# Patient Record
Sex: Male | Born: 1960 | Race: White | Hispanic: No | Marital: Married | Smoking: Former smoker
Health system: Southern US, Community
[De-identification: ages and names within clinical notes are randomized; demographics above are authoritative.]

## PROBLEM LIST (undated history)

## (undated) DIAGNOSIS — N200 Calculus of kidney: Secondary | ICD-10-CM

## (undated) DIAGNOSIS — M1990 Unspecified osteoarthritis, unspecified site: Secondary | ICD-10-CM

## (undated) DIAGNOSIS — E039 Hypothyroidism, unspecified: Secondary | ICD-10-CM

## (undated) DIAGNOSIS — E079 Disorder of thyroid, unspecified: Secondary | ICD-10-CM

## (undated) DIAGNOSIS — K219 Gastro-esophageal reflux disease without esophagitis: Secondary | ICD-10-CM

## (undated) DIAGNOSIS — G4730 Sleep apnea, unspecified: Secondary | ICD-10-CM

## (undated) DIAGNOSIS — E119 Type 2 diabetes mellitus without complications: Secondary | ICD-10-CM

## (undated) DIAGNOSIS — R011 Cardiac murmur, unspecified: Secondary | ICD-10-CM

## (undated) DIAGNOSIS — Z87442 Personal history of urinary calculi: Secondary | ICD-10-CM

## (undated) DIAGNOSIS — N281 Cyst of kidney, acquired: Secondary | ICD-10-CM

## (undated) DIAGNOSIS — C801 Malignant (primary) neoplasm, unspecified: Secondary | ICD-10-CM

## (undated) DIAGNOSIS — I10 Essential (primary) hypertension: Secondary | ICD-10-CM

## (undated) HISTORY — PX: KNEE ARTHROSCOPY W/ MENISCAL REPAIR: SHX1877

## (undated) HISTORY — DX: Unspecified osteoarthritis, unspecified site: M19.90

## (undated) HISTORY — PX: BLADDER TUMOR EXCISION: SHX238

## (undated) HISTORY — DX: Malignant (primary) neoplasm, unspecified: C80.1

## (undated) HISTORY — DX: Essential (primary) hypertension: I10

## (undated) HISTORY — PX: CARPAL TUNNEL WITH CUBITAL TUNNEL: SHX5608

## (undated) HISTORY — DX: Gastro-esophageal reflux disease without esophagitis: K21.9

## (undated) HISTORY — PX: BACK SURGERY: SHX140

## (undated) HISTORY — DX: Type 2 diabetes mellitus without complications: E11.9

## (undated) HISTORY — DX: Disorder of thyroid, unspecified: E07.9

## (undated) HISTORY — DX: Calculus of kidney: N20.0

## (undated) HISTORY — PX: NECK SURGERY: SHX720

## (undated) HISTORY — PX: ELBOW SURGERY: SHX618

---

## 2008-12-15 ENCOUNTER — Emergency Department (HOSPITAL_COMMUNITY): Admission: EM | Admit: 2008-12-15 | Discharge: 2008-12-15 | Payer: Self-pay | Admitting: Emergency Medicine

## 2015-09-06 ENCOUNTER — Other Ambulatory Visit (HOSPITAL_COMMUNITY): Payer: Self-pay | Admitting: Neurological Surgery

## 2015-09-06 DIAGNOSIS — M5126 Other intervertebral disc displacement, lumbar region: Secondary | ICD-10-CM

## 2015-09-13 ENCOUNTER — Ambulatory Visit (HOSPITAL_COMMUNITY)
Admission: RE | Admit: 2015-09-13 | Discharge: 2015-09-13 | Disposition: A | Discharge status: Home / Self Care | Payer: Medicare FFS | Source: Ambulatory Visit | Attending: Neurological Surgery | Admitting: Neurological Surgery

## 2015-09-13 DIAGNOSIS — M5126 Other intervertebral disc displacement, lumbar region: Secondary | ICD-10-CM | POA: Diagnosis present

## 2015-09-13 LAB — POCT I-STAT CREATININE: Creatinine, Ser: 1.1 mg/dL (ref 0.61–1.24)

## 2015-09-13 MED ORDER — GADOBENATE DIMEGLUMINE 529 MG/ML IV SOLN
18.0000 mL | Freq: Once | INTRAVENOUS | Status: AC | PRN
  Administered 2015-09-13: 18 mL via INTRAVENOUS

## 2015-12-03 ENCOUNTER — Other Ambulatory Visit (HOSPITAL_COMMUNITY): Payer: Self-pay | Admitting: Neurological Surgery

## 2015-12-03 DIAGNOSIS — M5416 Radiculopathy, lumbar region: Secondary | ICD-10-CM

## 2015-12-10 ENCOUNTER — Ambulatory Visit (HOSPITAL_COMMUNITY)
Admission: RE | Admit: 2015-12-10 | Discharge: 2015-12-10 | Disposition: A | Discharge status: Home / Self Care | Payer: Medicare FFS | Source: Ambulatory Visit | Attending: Neurological Surgery | Admitting: Neurological Surgery

## 2015-12-10 DIAGNOSIS — M5126 Other intervertebral disc displacement, lumbar region: Secondary | ICD-10-CM | POA: Diagnosis not present

## 2015-12-10 DIAGNOSIS — M2578 Osteophyte, vertebrae: Secondary | ICD-10-CM | POA: Diagnosis not present

## 2015-12-10 DIAGNOSIS — Z9889 Other specified postprocedural states: Secondary | ICD-10-CM | POA: Diagnosis not present

## 2015-12-10 DIAGNOSIS — M4806 Spinal stenosis, lumbar region: Secondary | ICD-10-CM | POA: Diagnosis not present

## 2015-12-10 DIAGNOSIS — M5416 Radiculopathy, lumbar region: Secondary | ICD-10-CM | POA: Insufficient documentation

## 2015-12-10 LAB — POCT I-STAT CREATININE: Creatinine, Ser: 1.1 mg/dL (ref 0.61–1.24)

## 2015-12-10 MED ORDER — GADOBENATE DIMEGLUMINE 529 MG/ML IV SOLN
20.0000 mL | Freq: Once | INTRAVENOUS | Status: AC | PRN
  Administered 2015-12-10: 20 mL via INTRAVENOUS

## 2015-12-30 ENCOUNTER — Other Ambulatory Visit (HOSPITAL_COMMUNITY): Payer: Self-pay | Admitting: Neurological Surgery

## 2015-12-30 DIAGNOSIS — M5000 Cervical disc disorder with myelopathy, unspecified cervical region: Secondary | ICD-10-CM

## 2016-01-09 ENCOUNTER — Ambulatory Visit (HOSPITAL_COMMUNITY)
Admission: RE | Admit: 2016-01-09 | Discharge: 2016-01-09 | Disposition: A | Discharge status: Home / Self Care | Payer: Medicare FFS | Source: Ambulatory Visit | Attending: Neurological Surgery | Admitting: Neurological Surgery

## 2016-01-09 DIAGNOSIS — M2578 Osteophyte, vertebrae: Secondary | ICD-10-CM | POA: Diagnosis not present

## 2016-01-09 DIAGNOSIS — M5000 Cervical disc disorder with myelopathy, unspecified cervical region: Secondary | ICD-10-CM | POA: Diagnosis not present

## 2016-01-09 DIAGNOSIS — M5124 Other intervertebral disc displacement, thoracic region: Secondary | ICD-10-CM | POA: Insufficient documentation

## 2016-01-09 DIAGNOSIS — M4802 Spinal stenosis, cervical region: Secondary | ICD-10-CM | POA: Insufficient documentation

## 2016-06-01 ENCOUNTER — Other Ambulatory Visit: Payer: Self-pay | Admitting: Neurological Surgery

## 2016-06-01 ENCOUNTER — Other Ambulatory Visit (HOSPITAL_COMMUNITY): Payer: Self-pay | Admitting: Neurological Surgery

## 2016-06-01 DIAGNOSIS — M5416 Radiculopathy, lumbar region: Secondary | ICD-10-CM

## 2016-06-01 DIAGNOSIS — M5124 Other intervertebral disc displacement, thoracic region: Secondary | ICD-10-CM

## 2016-06-30 ENCOUNTER — Other Ambulatory Visit (HOSPITAL_COMMUNITY): Payer: Self-pay | Admitting: Neurological Surgery

## 2016-06-30 DIAGNOSIS — IMO0002 Reserved for concepts with insufficient information to code with codable children: Secondary | ICD-10-CM

## 2016-06-30 DIAGNOSIS — M5020 Other cervical disc displacement, unspecified cervical region: Secondary | ICD-10-CM

## 2016-07-01 ENCOUNTER — Ambulatory Visit (HOSPITAL_COMMUNITY)
Admission: RE | Admit: 2016-07-01 | Discharge: 2016-07-01 | Disposition: A | Discharge status: Home / Self Care | Payer: Medicare FFS | Source: Ambulatory Visit | Attending: Neurological Surgery | Admitting: Neurological Surgery

## 2016-07-01 ENCOUNTER — Encounter (HOSPITAL_COMMUNITY): Payer: Self-pay

## 2016-07-01 DIAGNOSIS — I2510 Atherosclerotic heart disease of native coronary artery without angina pectoris: Secondary | ICD-10-CM | POA: Diagnosis not present

## 2016-07-01 DIAGNOSIS — M48061 Spinal stenosis, lumbar region without neurogenic claudication: Secondary | ICD-10-CM | POA: Insufficient documentation

## 2016-07-01 DIAGNOSIS — N200 Calculus of kidney: Secondary | ICD-10-CM | POA: Insufficient documentation

## 2016-07-01 DIAGNOSIS — M4802 Spinal stenosis, cervical region: Secondary | ICD-10-CM | POA: Insufficient documentation

## 2016-07-01 DIAGNOSIS — M5124 Other intervertebral disc displacement, thoracic region: Secondary | ICD-10-CM | POA: Insufficient documentation

## 2016-07-01 DIAGNOSIS — M2578 Osteophyte, vertebrae: Secondary | ICD-10-CM | POA: Insufficient documentation

## 2016-07-01 DIAGNOSIS — M5126 Other intervertebral disc displacement, lumbar region: Secondary | ICD-10-CM | POA: Diagnosis not present

## 2016-07-01 DIAGNOSIS — M4726 Other spondylosis with radiculopathy, lumbar region: Secondary | ICD-10-CM | POA: Diagnosis present

## 2016-07-01 DIAGNOSIS — IMO0002 Reserved for concepts with insufficient information to code with codable children: Secondary | ICD-10-CM

## 2016-07-01 DIAGNOSIS — M5020 Other cervical disc displacement, unspecified cervical region: Secondary | ICD-10-CM

## 2016-07-01 DIAGNOSIS — Z981 Arthrodesis status: Secondary | ICD-10-CM | POA: Diagnosis not present

## 2016-07-01 DIAGNOSIS — M5416 Radiculopathy, lumbar region: Secondary | ICD-10-CM

## 2016-07-01 DIAGNOSIS — M4722 Other spondylosis with radiculopathy, cervical region: Secondary | ICD-10-CM | POA: Insufficient documentation

## 2016-07-01 LAB — GLUCOSE, CAPILLARY
GLUCOSE-CAPILLARY: 207 mg/dL — AB (ref 65–99)
GLUCOSE-CAPILLARY: 131 mg/dL — AB (ref 65–99)

## 2016-07-01 MED ORDER — ONDANSETRON HCL 4 MG/2ML IJ SOLN: 4.0000 mg | Freq: Four times a day (QID) | INTRAMUSCULAR | Status: DC | PRN

## 2016-07-01 MED ORDER — HYDROCODONE-ACETAMINOPHEN 5-325 MG PO TABS
ORAL_TABLET | ORAL | Status: AC
  Filled 2016-07-01: qty 1

## 2016-07-01 MED ORDER — IOPAMIDOL (ISOVUE-M 300) INJECTION 61%
15.0000 mL | Freq: Once | INTRAMUSCULAR | Status: AC | PRN
  Administered 2016-07-01: 14 mL via INTRATHECAL

## 2016-07-01 MED ORDER — HYDROCODONE-ACETAMINOPHEN 5-325 MG PO TABS
1.0000 | ORAL_TABLET | ORAL | Status: DC | PRN
  Administered 2016-07-01: 1 via ORAL
  Filled 2016-07-01 (×2): qty 2

## 2016-07-01 MED ORDER — DIAZEPAM 5 MG PO TABS
ORAL_TABLET | ORAL | Status: AC
  Administered 2016-07-01: 10 mg via ORAL
  Filled 2016-07-01: qty 2

## 2016-07-01 MED ORDER — DIAZEPAM 5 MG PO TABS
10.0000 mg | ORAL_TABLET | Freq: Once | ORAL | Status: AC
  Administered 2016-07-01: 10 mg via ORAL
  Filled 2016-07-01: qty 2

## 2016-07-01 MED ORDER — LIDOCAINE HCL (PF) 1 % IJ SOLN
5.0000 mL | Freq: Once | INTRAMUSCULAR | Status: AC
Start: 2016-07-01 — End: 2016-07-01
  Administered 2016-07-01: 5 mL via INTRADERMAL

## 2016-07-01 NOTE — Progress Notes (Addendum)
Pt bp elevating, did not take metoprolol last night, both arms checked and larger cuff used. Dr Ellene Route was called, he will call back per office staff. Pt brought all meds, will continue to monitor. 160/105 p 105, 214/125 p 105 1133 another call made to office/ no call has been returned. Call was returned/ pt can leave per Dr Ellene Route, bp meds taken with bp lowering.

## 2016-07-01 NOTE — Procedures (Signed)
Asa Saunas is a 56 year old individual is had significant cervical and lumbar spondylosis with radiculopathy. He is undergone previous decompression at the L4-L5 level and has recurrent and residual lumbar radicular pain and back pain. He also has had a previous cervical spondylosis with radiculopathy and is undergone anterior decompression arthrodesis at the C7-T1 level. He now has symptoms in the C8 distribution the upper extremities. Because of the multitude of symptoms I advised a total myelogram and post milligrams CAT scan to better evaluate the nature of his problems and also because he has significant positional occurrences with pain particularly in his lumbar spine is suggested dynamic study would be of benefit to better elucidate the nature of his pain.  Pre op Dx: Lumbar spondylosis cervical spondylosis with radiculopathy Post op Dx: Same Procedure: Total myelogram Surgeon: Mellody Masri Puncture level: L2-3 Fluid color: Clear colorless Injection: Isovue-300, 14 mL Findings: Moderate spondylosis of the lumbar spine . mild spondylosis of the cervical spine. Further evaluation with CT scanning

## 2016-07-01 NOTE — Discharge Instructions (Signed)
Myelography, Care After °These instructions give you information on caring for yourself after your procedure. Your doctor may also give you more specific instructions. Call your doctor if you have any problems or questions after your procedure. °HOME CARE °· Rest the first day. °· When you rest, lie flat, with your head slightly raised (elevated). °· Avoid heavy lifting and activity for 48 hours, or as told by your doctor. °· You may take the bandage (dressing) off one day after the test, or as told by your doctor. °· Take all medicines only as told by your doctor. °· Ask your doctor when it is okay to take a shower or bath. °· Ask your doctor when your test results will be ready and how you can get them. Make sure you follow up and get your results. °· Do not drink alcohol for 24 hours, or as told by your doctor. °· Drink enough fluid to keep your pee (urine) clear or pale yellow. °GET HELP IF:  °· You have a fever. °· You have a headache. °· You feel sick to your stomach (nauseous) or throw up (vomit). °· You have pain or cramping in your belly (abdomen). °GET HELP RIGHT AWAY IF:  °· You have a headache with a stiff neck or fever. °· You have trouble breathing. °· Any of the places where the needles were put in are: °¨ Puffy (swollen) or red. °¨ Sore or hot to the touch. °¨ Draining yellowish-white fluid (pus). °¨ Bleeding. °MAKE SURE YOU: °· Understand these instructions. °· Will watch your condition. °· Will get help right away if you are not doing well or get worse. °This information is not intended to replace advice given to you by your health care provider. Make sure you discuss any questions you have with your health care provider. °Document Released: 02/11/2008 Document Revised: 05/25/2014 Document Reviewed: 02/14/2015 °Elsevier Interactive Patient Education © 2017 [employer] ° °

## 2017-08-31 ENCOUNTER — Other Ambulatory Visit (HOSPITAL_COMMUNITY): Payer: Self-pay | Admitting: Neurological Surgery

## 2017-08-31 ENCOUNTER — Other Ambulatory Visit (HOSPITAL_COMMUNITY): Payer: Self-pay | Admitting: Neurosurgery

## 2017-08-31 DIAGNOSIS — R29898 Other symptoms and signs involving the musculoskeletal system: Secondary | ICD-10-CM

## 2017-08-31 DIAGNOSIS — M47892 Other spondylosis, cervical region: Secondary | ICD-10-CM

## 2017-09-03 ENCOUNTER — Ambulatory Visit (HOSPITAL_COMMUNITY)
Admission: RE | Admit: 2017-09-03 | Discharge: 2017-09-03 | Disposition: A | Discharge status: Home / Self Care | Payer: Medicare FFS | Source: Ambulatory Visit | Attending: Neurosurgery | Admitting: Neurosurgery

## 2017-09-03 DIAGNOSIS — R29898 Other symptoms and signs involving the musculoskeletal system: Secondary | ICD-10-CM | POA: Diagnosis present

## 2017-09-03 DIAGNOSIS — M48061 Spinal stenosis, lumbar region without neurogenic claudication: Secondary | ICD-10-CM | POA: Insufficient documentation

## 2017-09-03 DIAGNOSIS — M47892 Other spondylosis, cervical region: Secondary | ICD-10-CM | POA: Insufficient documentation

## 2017-09-03 LAB — POCT I-STAT CREATININE: Creatinine, Ser: 1.1 mg/dL (ref 0.61–1.24)

## 2017-09-03 MED ORDER — GADOBENATE DIMEGLUMINE 529 MG/ML IV SOLN
20.0000 mL | Freq: Once | INTRAVENOUS | Status: AC | PRN
  Administered 2017-09-03: 20 mL via INTRAVENOUS

## 2017-10-19 ENCOUNTER — Other Ambulatory Visit: Payer: Self-pay | Admitting: Neurological Surgery

## 2017-10-19 DIAGNOSIS — M48062 Spinal stenosis, lumbar region with neurogenic claudication: Secondary | ICD-10-CM

## 2018-06-14 ENCOUNTER — Other Ambulatory Visit: Payer: Self-pay | Admitting: Neurological Surgery

## 2018-06-14 DIAGNOSIS — E109 Type 1 diabetes mellitus without complications: Secondary | ICD-10-CM

## 2018-07-14 ENCOUNTER — Other Ambulatory Visit: Payer: Self-pay | Admitting: *Deleted

## 2018-07-14 ENCOUNTER — Other Ambulatory Visit: Payer: Self-pay | Admitting: Neurological Surgery

## 2018-07-15 ENCOUNTER — Telehealth: Payer: Self-pay | Admitting: Vascular Surgery

## 2018-07-15 ENCOUNTER — Encounter: Payer: Self-pay | Admitting: Vascular Surgery

## 2018-07-15 NOTE — Telephone Encounter (Signed)
-----   Message from Willy Eddy, RN sent at 07/13/2018  3:16 PM EST ----- Regarding: office appointment with Dr. Carlis Abbott Please sched. Office appointment with Dr. Carlis Abbott. Alif with Dr. Ellene Route is scheduled for 09/05/2018.Remind patient to bring all films related to this procedure with him to this appt. Thanks, [employer]

## 2018-07-15 NOTE — Telephone Encounter (Signed)
sch appt lvm mld ltr 08/23/2018 1215pm f/u MD

## 2018-08-05 ENCOUNTER — Ambulatory Visit (HOSPITAL_COMMUNITY): Payer: Medicare PPO

## 2018-08-05 ENCOUNTER — Inpatient Hospital Stay: Admission: RE | Admit: 2018-08-05 | Payer: Medicare FFS | Source: Ambulatory Visit

## 2018-08-16 ENCOUNTER — Other Ambulatory Visit: Payer: Self-pay | Admitting: *Deleted

## 2018-08-18 ENCOUNTER — Other Ambulatory Visit: Payer: Self-pay

## 2018-08-18 ENCOUNTER — Ambulatory Visit
Admission: RE | Admit: 2018-08-18 | Discharge: 2018-08-18 | Disposition: A | Discharge status: Home / Self Care | Payer: Medicare PPO | Source: Ambulatory Visit | Attending: Neurological Surgery | Admitting: Neurological Surgery

## 2018-08-18 DIAGNOSIS — E109 Type 1 diabetes mellitus without complications: Secondary | ICD-10-CM

## 2018-08-23 ENCOUNTER — Other Ambulatory Visit: Payer: Self-pay

## 2018-08-23 ENCOUNTER — Ambulatory Visit: Payer: Medicare FFS | Admitting: Vascular Surgery

## 2018-08-23 ENCOUNTER — Ambulatory Visit (INDEPENDENT_AMBULATORY_CARE_PROVIDER_SITE_OTHER): Payer: Medicare PPO | Admitting: Vascular Surgery

## 2018-08-23 ENCOUNTER — Encounter: Payer: Self-pay | Admitting: Vascular Surgery

## 2018-08-23 VITALS — BP 146/95 | HR 100 | Temp 97.6°F | Resp 20 | Ht 68.0 in | Wt 213.0 lb

## 2018-08-23 DIAGNOSIS — M5137 Other intervertebral disc degeneration, lumbosacral region: Secondary | ICD-10-CM

## 2018-08-23 NOTE — Progress Notes (Signed)
Vascular and Vein Specialist of [facility]  Patient name: Tony Christensen MRN: [id number] DOB: [birth year] Sex: male  REASON FOR CONSULT: Discuss anterior exposure for L4-5 and L5-S1 disc surgery.  HPI: Tony Christensen is a 58 y.o. male, who is day for discussion of anterior exposure for spine surgery with Dr. Ellene Route.  He has a long history of progressive disability associated with this.  Has had prior cervical fusion and also discectomy in the past.  He has had progressive pain somewhat in his back but progressive weakness in his lower extremities as well.  He is seen today for discussion of anterior exposure.  Not have a history of cardiac disease.  Past Medical History:  Diagnosis Date  . Arthritis   . Cancer ([place])    Bladder/lung  . Diabetes mellitus without complication ([place])   . GERD (gastroesophageal reflux disease)   . Hypertension   . Kidney stones   . Thyroid disease     Family History  Problem Relation Age of Onset  . Fibromyalgia Mother   . Arthritis Mother   . Brain cancer Father   . Lung cancer Father   . Hypertension Father   . Heart disease Father     SOCIAL HISTORY: Social History   Socioeconomic History  . Marital status: Married    Spouse name: Not on file  . Number of children: Not on file  . Years of education: Not on file  . Highest education level: Not on file  Occupational History  . Not on file  Social Needs  . Financial resource strain: Not on file  . Food insecurity:    Worry: Not on file    Inability: Not on file  . Transportation needs:    Medical: Not on file    Non-medical: Not on file  Tobacco Use  . Smoking status: Former Research scientist (life sciences)  . Smokeless tobacco: Never Used  Substance and Sexual Activity  . Alcohol use: Yes    Comment: socially  . Drug use: Never  . Sexual activity: Not on file  Lifestyle  . Physical activity:    Days per week: Not on file    Minutes per session: Not on file  .  Stress: Not on file  Relationships  . Social connections:    Talks on phone: Not on file    Gets together: Not on file    Attends religious service: Not on file    Active member of club or organization: Not on file    Attends meetings of clubs or organizations: Not on file    Relationship status: Not on file  . Intimate partner violence:    Fear of current or ex partner: Not on file    Emotionally abused: Not on file    Physically abused: Not on file    Forced sexual activity: Not on file  Other Topics Concern  . Not on file  Social History Narrative  . Not on file    Allergies  Allergen Reactions  . Ceclor [Cefaclor] Anaphylaxis    Current Outpatient Medications  Medication Sig Dispense Refill  . amLODipine (NORVASC) 10 MG tablet Take 10 mg by mouth at bedtime.     Marland Kitchen azelastine (ASTELIN) 0.1 % nasal spray Place 2 sprays into both nostrils 2 (two) times daily as needed for allergies.     . benazepril (LOTENSIN) 5 MG tablet Take 5 mg by mouth daily.    . Cholecalciferol (VITAMIN D-3) 5000 units TABS Take 10,000  Units by mouth daily.     . cyclobenzaprine (FLEXERIL) 10 MG tablet Take 10 mg by mouth at bedtime.     . diphenhydrAMINE (BENADRYL) 25 mg capsule Take 50-100 mg by mouth daily as needed for allergies.    . fluticasone (FLONASE) 50 MCG/ACT nasal spray Place 1 spray into both nostrils daily as needed for allergies.     . HONEY PO Take 15 mLs by mouth every morning.    Marland Kitchen HYDROcodone-acetaminophen (NORCO) 10-325 MG tablet Take 1 tablet by mouth 3 (three) times daily as needed for moderate pain. for pain    . levothyroxine (SYNTHROID, LEVOTHROID) 75 MCG tablet Take 75 mcg by mouth daily before breakfast.     . magnesium chloride (SLOW-MAG) 64 MG TBEC SR tablet Take 1 tablet by mouth daily.    . meloxicam (MOBIC) 7.5 MG tablet Take 7.5 mg by mouth daily.    . metFORMIN (GLUCOPHAGE) 500 MG tablet Take 500 mg by mouth 2 (two) times daily with a meal.    . metoprolol succinate  (TOPROL-XL) 50 MG 24 hr tablet Take 50 mg by mouth every evening. Take with or immediately following a meal.    . Multiple Vitamins-Minerals (MULTIVITAMIN WITH MINERALS) tablet Take 1 tablet by mouth daily.    . nortriptyline (PAMELOR) 10 MG capsule Take 10 mg by mouth at bedtime.    Marland Kitchen NOVOLOG FLEXPEN 100 UNIT/ML FlexPen Inject 6-18 Units into the skin See admin instructions. Takes with meals as needed    . omeprazole (PRILOSEC) 20 MG capsule Take 20 mg by mouth every morning.    . pregabalin (LYRICA) 100 MG capsule Take 100 mg by mouth 2 (two) times daily.     . tamsulosin (FLOMAX) 0.4 MG CAPS capsule Take 0.4 mg by mouth every evening.    Marland Kitchen atorvastatin (LIPITOR) 20 MG tablet Take 20 mg by mouth daily.     No current facility-administered medications for this visit.     REVIEW OF SYSTEMS:  [X]  denotes positive finding, [ ]  denotes negative finding Cardiac  Comments:  Chest pain or chest pressure:    Shortness of breath upon exertion:    Short of breath when lying flat:    Irregular heart rhythm:        Vascular    Pain in calf, thigh, or hip brought on by ambulation: x   Pain in feet at night that wakes you up from your sleep:  x   Blood clot in your veins:    Leg swelling:  x       Pulmonary    Oxygen at home:    Productive cough:     Wheezing:         Neurologic    Sudden weakness in arms or legs:  x   Sudden numbness in arms or legs:  x   Sudden onset of difficulty speaking or slurred speech:    Temporary loss of vision in one eye:     Problems with dizziness:         Gastrointestinal    Blood in stool:     Vomited blood:         Genitourinary    Burning when urinating:     Blood in urine:        Psychiatric    Major depression:         Hematologic    Bleeding problems:    Problems with blood clotting too easily:  Skin    Rashes or ulcers:        Constitutional    Fever or chills:      PHYSICAL EXAM: Vitals:   08/23/18 1057  BP: (!) 146/95   Pulse: 100  Resp: 20  Temp: 97.6 F (36.4 C)  SpO2: 97%  Weight: 213 lb (96.6 kg)  Height: 5\' 8"  (1.727 m)    GENERAL: The patient is a well-nourished male, in no acute distress. The vital signs are documented above. CARDIOVASCULAR: Carotid arteries without bruits bilaterally.  2+ radial and 2+ femoral pulses bilaterally. PULMONARY: There is good air exchange  ABDOMEN: Soft and non-tender obese.  No masses noted MUSCULOSKELETAL: There are no major deformities or cyanosis. NEUROLOGIC: No focal weakness or paresthesias are detected. SKIN: There are no ulcers or rashes noted. PSYCHIATRIC: The patient has a normal affect.  DATA:  CT of his spine from 2018 was reviewed showing minimal atherosclerotic change in his aorta.  I have a report of a recent CT of his lumbar [place].  I do not have the actual films but mention is made of mild scattered atherosclerotic change  MEDICAL ISSUES: Had long discussion with the patient regarding my role in his surgery.  Explained that Dr. Ellene Route has determined that his best surgical option would be from the anterior approach.  Explained mobilization of the rectus muscle, retroperitoneal space mobilization and mobilization of the left ureter.  Also discussed mobilization of the arterial and venous structures overlying the spine with potential injury to all the structures.  Particularly discussed the potential for major venous injury.  Patient understands and wished to proceed as scheduled on 09/05/2018   Rosetta Posner, MD [facility] Vascular and Vein Specialists of [facility] Tel [phone] Pager [phone]

## 2018-08-30 ENCOUNTER — Encounter (HOSPITAL_COMMUNITY): Payer: Self-pay | Admitting: Certified Registered"

## 2018-09-02 ENCOUNTER — Encounter (HOSPITAL_COMMUNITY): Payer: Self-pay

## 2018-09-02 ENCOUNTER — Other Ambulatory Visit (HOSPITAL_COMMUNITY): Payer: Medicare PPO

## 2018-09-02 ENCOUNTER — Ambulatory Visit (HOSPITAL_COMMUNITY): Payer: Medicare PPO

## 2018-09-05 ENCOUNTER — Encounter (HOSPITAL_COMMUNITY): Admission: RE | Payer: Self-pay | Source: Home / Self Care

## 2018-09-05 ENCOUNTER — Inpatient Hospital Stay (HOSPITAL_COMMUNITY): Admission: RE | Admit: 2018-09-05 | Payer: Medicare PPO | Source: Home / Self Care | Admitting: Neurological Surgery

## 2018-09-05 SURGERY — ANTERIOR LUMBAR FUSION 2 LEVELS
Anesthesia: General

## 2018-09-22 ENCOUNTER — Other Ambulatory Visit (HOSPITAL_COMMUNITY): Payer: Self-pay | Admitting: Neurological Surgery

## 2018-09-22 DIAGNOSIS — M48062 Spinal stenosis, lumbar region with neurogenic claudication: Secondary | ICD-10-CM

## 2018-10-03 ENCOUNTER — Ambulatory Visit (HOSPITAL_COMMUNITY)
Admission: RE | Admit: 2018-10-03 | Discharge: 2018-10-03 | Disposition: A | Discharge status: Home / Self Care | Payer: Medicare PPO | Source: Ambulatory Visit | Attending: Neurological Surgery | Admitting: Neurological Surgery

## 2018-10-03 ENCOUNTER — Other Ambulatory Visit: Payer: Self-pay | Admitting: *Deleted

## 2018-10-03 ENCOUNTER — Other Ambulatory Visit: Payer: Self-pay

## 2018-10-03 ENCOUNTER — Other Ambulatory Visit: Payer: Self-pay | Admitting: Neurological Surgery

## 2018-10-03 DIAGNOSIS — M48062 Spinal stenosis, lumbar region with neurogenic claudication: Secondary | ICD-10-CM | POA: Diagnosis present

## 2018-10-25 ENCOUNTER — Other Ambulatory Visit: Payer: Self-pay | Admitting: *Deleted

## 2018-10-25 NOTE — Progress Notes (Signed)
Spoke with Janett Billow (surgery scheduler) at Dr. Wallene Huh office that will need to change surgeon from Dr. Donnetta Hutching to Dr. Carlis Abbott for abdominal exposure or re-schedule. She stated that surgeon change will be fine and she will contact patient and Dr. Ellene Route of change.

## 2018-10-27 ENCOUNTER — Other Ambulatory Visit: Payer: Self-pay | Admitting: *Deleted

## 2018-11-01 ENCOUNTER — Other Ambulatory Visit: Payer: Self-pay | Admitting: *Deleted

## 2018-11-02 NOTE — Pre-Procedure Instructions (Signed)
Tony Christensen  11/02/2018      [facility] [phone] [place] [address] Phone: [phone] Fax: [phone]    Your procedure is scheduled on Mon., November 07, 2018 from 7:30AM-3:45PM  Report to [facility] Entrance "A" at 5:30AM  Call this number if you have problems the morning of surgery:  [phone]   Remember:  Do not eat or drink after midnight on June 21st    Take these medicines the morning of surgery with A SIP OF WATER: Atorvastatin (LIPITOR), Levothyroxine (SYNTHROID, LEVOTHROID), Omeprazole (PRILOSEC), and Pregabalin (LYRICA)   If needed: Azelastine (ASTELIN) and DiphenhydrAMINE (BENADRYL)  As of today, stop taking all Other Aspirin Products, Vitamins, Fish oils, and Herbal medications. Also stop all NSAIDS i.e. Advil, Ibuprofen, Motrin, Aleve, Anaprox, Naproxen, BC, Goody Powders, and all Supplements. Including: Meloxicam (MOBIC)  Do not take MetFORMIN (GLUCOPHAGE) the morning of surgery.  How to Manage Your Diabetes Before and After Surgery  Why is it important to control my blood sugar before and after surgery? . Improving blood sugar levels before and after surgery helps healing and can limit problems. . A way of improving blood sugar control is eating a healthy diet by: o  Eating less sugar and carbohydrates o  Increasing activity/exercise o  Talking with your doctor about reaching your blood sugar goals . High blood sugars (greater than 180 mg/dL) can raise your risk of infections and slow your recovery, so you will need to focus on controlling your diabetes during the weeks before surgery. . Make sure that the doctor who takes care of your diabetes knows about your planned surgery including the date and location.  How do I manage my blood sugar before surgery? . Check your blood sugar at least 4 times a day, starting 2 days before surgery, to make sure that the level is  not too high or low. o Check your blood sugar the morning of your surgery when you wake up and every 2 hours until you get to the Short Stay unit. . If your blood sugar is less than 70 mg/dL, you will need to treat for low blood sugar: o Do not take insulin. o Treat a low blood sugar (less than 70 mg/dL) with  cup of clear juice (cranberry or apple), 4 glucose tablets, OR glucose gel. Recheck blood sugar in 15 minutes after treatment (to make sure it is greater than 70 mg/dL). If your blood sugar is not greater than 70 mg/dL on recheck, call [phone] o  for further instructions. . If your CBG is greater than 220 mg/dL, you may take  (4-11 units of Novolog) of your sliding scale (correction) dose of insulin.  . If you are admitted to the hospital after surgery: o Your blood sugar will be checked by the staff and you will probably be given insulin after surgery (instead of oral diabetes medicines) to make sure you have good blood sugar levels. o The goal for blood sugar control after surgery is 80-180 mg/dL. Tony Christensen Reviewed and Endorsed by [facility] Patient Education Committee, August 2015   Special instructions:   [facility]- Preparing For Surgery  Before surgery, you can play an important role. Because skin is not sterile, your skin needs to be as free of germs as possible. You can reduce the number of germs on your skin by washing with CHG (chlorahexidine gluconate) Soap before surgery.  CHG is an antiseptic cleaner which kills germs and bonds with the skin to continue killing germs even after washing.    Please do not use if you have an allergy to CHG or antibacterial soaps. If your skin becomes reddened/irritated stop using the CHG.  Do not shave (including legs and underarms) for at least 48 hours prior to first CHG shower. It is OK to shave your face.  Please follow these instructions carefully.   1. Shower the NIGHT BEFORE SURGERY and the MORNING OF SURGERY with CHG.   2. If you  chose to wash your hair, wash your hair first as usual with your normal shampoo.  3. After you shampoo, rinse your hair and body thoroughly to remove the shampoo.  4. Use CHG as you would any other liquid soap. You can apply CHG directly to the skin and wash gently with a scrungie or a clean washcloth.   5. Apply the CHG Soap to your body ONLY FROM THE NECK DOWN.  Do not use on open wounds or open sores. Avoid contact with your eyes, ears, mouth and genitals (private parts). Wash Face and genitals (private parts)  with your normal soap.  6. Wash thoroughly, paying special attention to the area where your surgery will be performed.  7. Thoroughly rinse your body with warm water from the neck down.  8. DO NOT shower/wash with your normal soap after using and rinsing off the CHG Soap.  9. Pat yourself dry with a CLEAN TOWEL.  10. Wear CLEAN PAJAMAS to bed the night before surgery, wear comfortable clothes the morning of surgery  11. Place CLEAN SHEETS on your bed the night of your first shower and DO NOT SLEEP WITH PETS.  Day of Surgery:  Oral Hygiene is also important to reduce your risk of infection.  Remember - BRUSH YOUR TEETH THE MORNING OF SURGERY WITH YOUR REGULAR TOOTHPASTE  .  Do not wear jewelry.  Do not wear lotions, powders, colognes, or deodorant.  Do not shave 48 hours prior to surgery.  Men may shave face.  Do not bring valuables to the hospital.  [facility] is not responsible for any belongings or valuables.  Contacts, dentures or bridgework may not be worn into surgery.   For patients admitted to the hospital, discharge time will be determined by your treatment team.  Patients discharged the day of surgery will not be allowed to drive home.   Please wear clean clothes to the hospital/surgery center.    Please read over the following fact sheets that you were given. Pain Booklet, Coughing and Deep Breathing, MRSA Information and Surgical Site Infection  Prevention

## 2018-11-03 ENCOUNTER — Encounter (HOSPITAL_COMMUNITY): Payer: Self-pay

## 2018-11-03 ENCOUNTER — Other Ambulatory Visit: Payer: Self-pay

## 2018-11-03 ENCOUNTER — Encounter (HOSPITAL_COMMUNITY)
Admission: RE | Admit: 2018-11-03 | Discharge: 2018-11-03 | Disposition: A | Discharge status: Home / Self Care | Payer: Medicare PPO | Source: Ambulatory Visit | Attending: Neurological Surgery | Admitting: Neurological Surgery

## 2018-11-03 ENCOUNTER — Other Ambulatory Visit (HOSPITAL_COMMUNITY)
Admission: RE | Admit: 2018-11-03 | Discharge: 2018-11-03 | Disposition: A | Discharge status: Home / Self Care | Payer: Medicare PPO | Source: Ambulatory Visit | Attending: Neurological Surgery | Admitting: Neurological Surgery

## 2018-11-03 DIAGNOSIS — E119 Type 2 diabetes mellitus without complications: Secondary | ICD-10-CM | POA: Diagnosis not present

## 2018-11-03 DIAGNOSIS — I498 Other specified cardiac arrhythmias: Secondary | ICD-10-CM | POA: Insufficient documentation

## 2018-11-03 DIAGNOSIS — Z01818 Encounter for other preprocedural examination: Secondary | ICD-10-CM | POA: Diagnosis not present

## 2018-11-03 HISTORY — DX: Hypothyroidism, unspecified: E03.9

## 2018-11-03 HISTORY — DX: Cardiac murmur, unspecified: R01.1

## 2018-11-03 HISTORY — DX: Sleep apnea, unspecified: G47.30

## 2018-11-03 HISTORY — DX: Cyst of kidney, acquired: N28.1

## 2018-11-03 HISTORY — DX: Personal history of urinary calculi: Z87.442

## 2018-11-03 LAB — BASIC METABOLIC PANEL
Anion gap: 9 (ref 5–15)
BUN: 10 mg/dL (ref 6–20)
CO2: 26 mmol/L (ref 22–32)
Calcium: 9.2 mg/dL (ref 8.9–10.3)
Chloride: 101 mmol/L (ref 98–111)
Creatinine, Ser: 1 mg/dL (ref 0.61–1.24)
GFR calc Af Amer: >60 mL/min (ref >60)
GFR calc non Af Amer: >60 mL/min (ref >60)
Glucose, Bld: 195 mg/dL — ABNORMAL HIGH (ref 70–99)
Potassium: 3.8 mmol/L (ref 3.5–5.1)
Sodium: 136 mmol/L (ref 135–145)

## 2018-11-03 LAB — TYPE AND SCREEN
ABO/RH(D): A POS
Antibody Screen: NEGATIVE

## 2018-11-03 LAB — CBC
HCT: 46.5 % (ref 39.0–52.0)
Hemoglobin: 16 g/dL (ref 13.0–17.0)
MCH: 31 pg (ref 26.0–34.0)
MCHC: 34.4 g/dL (ref 30.0–36.0)
MCV: 90.1 fL (ref 80.0–100.0)
Platelets: 285 10*3/uL (ref 150–400)
RBC: 5.16 MIL/uL (ref 4.22–5.81)
RDW: 12.2 % (ref 11.5–15.5)
WBC: 7.4 10*3/uL (ref 4.0–10.5)
nRBC: 0 % (ref 0.0–0.2)

## 2018-11-03 LAB — HEMOGLOBIN A1C
Hgb A1c MFr Bld: 8.3 % — ABNORMAL HIGH (ref 4.8–5.6)
Mean Plasma Glucose: 191.51 mg/dL

## 2018-11-03 LAB — ABO/RH: ABO/RH(D): A POS

## 2018-11-03 LAB — GLUCOSE, CAPILLARY: Glucose-Capillary: 186 mg/dL — ABNORMAL HIGH (ref 70–99)

## 2018-11-03 LAB — SURGICAL PCR SCREEN
MRSA, PCR: NEGATIVE
Staphylococcus aureus: POSITIVE — AB

## 2018-11-03 NOTE — Progress Notes (Signed)
PCP - Elisabeth Pigeon, NP Cardiologist - Dr. Renard Hamper  Chest x-ray - n/a EKG - 11/03/2018 Stress Test - 2018 ECHO - patient unsure Cardiac Cath - patient denies  Sleep Study - yes, patient states it was in 1999-2000 but unsure where it was. CPAP - no  Fasting Blood Sugar - 190-200 Checks Blood Sugar 1  time a day  Blood Thinner Instructions: n/a Aspirin Instructions: n/a  Anesthesia review: yes, cardiac history; records requested.  Patient denies shortness of breath, fever, cough and chest pain at PAT appointment.  Patient's HR was elevated at 100 upon arrival to PAT.  Patient left to get to his COVID testing appointment before I could recheck his VS.  However, HR 92 on EKG.  Coronavirus Screening  Have you experienced the following symptoms:  Cough yes/no: No Fever (>100.74F)  yes/no: No Runny nose yes/no: No Sore throat yes/no: No Difficulty breathing/shortness of breath  yes/no: No  Have you or a family member traveled in the last 14 days and where? yes/no: No   If the patient indicates "YES" to the above questions, their PAT will be rescheduled to limit the exposure to others and, the surgeon will be notified. THE PATIENT WILL NEED TO BE ASYMPTOMATIC FOR 14 DAYS.   If the patient is not experiencing any of these symptoms, the PAT nurse will instruct them to NOT bring anyone with them to their appointment since they may have these symptoms or traveled as well.   Please remind your patients and families that hospital visitation restrictions are in effect and the importance of the restrictions.    Patient verbalized understanding of instructions that were given to them at the PAT appointment. Patient was also instructed that they will need to review over the PAT instructions again at home before surgery.

## 2018-11-04 LAB — SARS CORONAVIRUS 2 (TAT 6-24 HRS)

## 2018-11-04 MED ORDER — VANCOMYCIN HCL 10 G IV SOLR
1500.0000 mg | INTRAVENOUS | Status: AC
  Administered 2018-11-07 (×2): 1500 mg via INTRAVENOUS
  Filled 2018-11-04: qty 1500

## 2018-11-04 NOTE — Anesthesia Preprocedure Evaluation (Addendum)
Anesthesia Evaluation  Patient identified by MRN, date of birth, ID band Patient awake    Reviewed: Allergy & Precautions, NPO status , Patient's Chart, lab work & pertinent test results  History of Anesthesia Complications Negative for: history of anesthetic complications  Airway Mallampati: II  TM Distance: >3 FB Neck ROM: Full    Dental no notable dental hx. (+) Dental Advisory Given   Pulmonary neg pulmonary ROS, former smoker,    Pulmonary exam normal        Cardiovascular hypertension, Pt. on medications and Pt. on home beta blockers Normal cardiovascular exam     Neuro/Psych negative neurological ROS  negative psych ROS   GI/Hepatic Neg liver ROS, GERD  ,  Endo/Other  diabetesHypothyroidism   Renal/GU negative Renal ROS     Musculoskeletal negative musculoskeletal ROS (+)   Abdominal   Peds  Hematology negative hematology ROS (+)   Anesthesia Other Findings Day of surgery medications reviewed with the patient.  Reproductive/Obstetrics                           Anesthesia Physical Anesthesia Plan  ASA: II  Anesthesia Plan: General   Post-op Pain Management:    Induction: Intravenous  PONV Risk Score and Plan: 4 or greater and Ondansetron, Dexamethasone, Scopolamine patch - Pre-op and Diphenhydramine  Airway Management Planned: Oral ETT  Additional Equipment:   Intra-op Plan:   Post-operative Plan: Extubation in OR  Informed Consent: I have reviewed the patients History and Physical, chart, labs and discussed the procedure including the risks, benefits and alternatives for the proposed anesthesia with the patient or authorized representative who has indicated his/her understanding and acceptance.     Dental advisory given  Plan Discussed with: CRNA, Anesthesiologist and Surgeon  Anesthesia Plan Comments: (Pt reports being told he had a heart murmur when he was 18  and says over the years some doctors have confirmed that he has a murmur and others have told him that he does not. He says that in 2018 his PCP sent him to be evaluated by cardiologist Dr. Ninfa Meeker in [place] and he had a stress test that he reports was normal and he remembers being told by Dr. Janith Lima that he did not have a murmur. He currently denies any CV symptoms, has no hx of CAD, no exertional symptoms. Records have been requested from cardiology (not received as of 6/19, office is closed today, unlikely they will be received before 6/22. Records also requested from [facility] where Dr. Janith Lima is affiliated but they had no cardiac studies on file). Discussed with Dr. Linna Caprice. If pt remains asymptomatic okay to proceed.)   Anesthesia Quick Evaluation

## 2018-11-07 ENCOUNTER — Inpatient Hospital Stay (HOSPITAL_COMMUNITY)
Admission: RE | Admit: 2018-11-07 | Discharge: 2018-11-12 | DRG: 455 | Disposition: A | Discharge status: Home / Self Care | Payer: Medicare PPO | Attending: Neurological Surgery | Admitting: Neurological Surgery

## 2018-11-07 ENCOUNTER — Inpatient Hospital Stay (HOSPITAL_COMMUNITY): Payer: Medicare PPO | Admitting: Anesthesiology

## 2018-11-07 ENCOUNTER — Encounter (HOSPITAL_COMMUNITY)
Admission: RE | Disposition: A | Discharge status: Home / Self Care | Payer: Self-pay | Source: Home / Self Care | Attending: Neurological Surgery

## 2018-11-07 ENCOUNTER — Inpatient Hospital Stay (HOSPITAL_COMMUNITY): Payer: Medicare PPO

## 2018-11-07 ENCOUNTER — Encounter (HOSPITAL_COMMUNITY): Payer: Self-pay | Admitting: Surgery

## 2018-11-07 ENCOUNTER — Other Ambulatory Visit: Payer: Self-pay

## 2018-11-07 ENCOUNTER — Inpatient Hospital Stay (HOSPITAL_COMMUNITY): Payer: Medicare PPO | Admitting: Physician Assistant

## 2018-11-07 DIAGNOSIS — M48061 Spinal stenosis, lumbar region without neurogenic claudication: Secondary | ICD-10-CM | POA: Diagnosis present

## 2018-11-07 DIAGNOSIS — Z808 Family history of malignant neoplasm of other organs or systems: Secondary | ICD-10-CM | POA: Diagnosis not present

## 2018-11-07 DIAGNOSIS — M5416 Radiculopathy, lumbar region: Secondary | ICD-10-CM

## 2018-11-07 DIAGNOSIS — K219 Gastro-esophageal reflux disease without esophagitis: Secondary | ICD-10-CM | POA: Diagnosis present

## 2018-11-07 DIAGNOSIS — M2578 Osteophyte, vertebrae: Secondary | ICD-10-CM | POA: Diagnosis present

## 2018-11-07 DIAGNOSIS — Z87442 Personal history of urinary calculi: Secondary | ICD-10-CM

## 2018-11-07 DIAGNOSIS — E039 Hypothyroidism, unspecified: Secondary | ICD-10-CM | POA: Diagnosis present

## 2018-11-07 DIAGNOSIS — Z8249 Family history of ischemic heart disease and other diseases of the circulatory system: Secondary | ICD-10-CM

## 2018-11-07 DIAGNOSIS — M4317 Spondylolisthesis, lumbosacral region: Secondary | ICD-10-CM | POA: Diagnosis not present

## 2018-11-07 DIAGNOSIS — I10 Essential (primary) hypertension: Secondary | ICD-10-CM | POA: Diagnosis present

## 2018-11-07 DIAGNOSIS — Z1159 Encounter for screening for other viral diseases: Secondary | ICD-10-CM | POA: Diagnosis not present

## 2018-11-07 DIAGNOSIS — Z419 Encounter for procedure for purposes other than remedying health state, unspecified: Secondary | ICD-10-CM

## 2018-11-07 DIAGNOSIS — Z801 Family history of malignant neoplasm of trachea, bronchus and lung: Secondary | ICD-10-CM | POA: Diagnosis not present

## 2018-11-07 DIAGNOSIS — G4730 Sleep apnea, unspecified: Secondary | ICD-10-CM | POA: Diagnosis present

## 2018-11-07 DIAGNOSIS — Z79890 Hormone replacement therapy (postmenopausal): Secondary | ICD-10-CM

## 2018-11-07 DIAGNOSIS — E119 Type 2 diabetes mellitus without complications: Secondary | ICD-10-CM | POA: Diagnosis present

## 2018-11-07 DIAGNOSIS — Z87891 Personal history of nicotine dependence: Secondary | ICD-10-CM

## 2018-11-07 DIAGNOSIS — M5417 Radiculopathy, lumbosacral region: Secondary | ICD-10-CM

## 2018-11-07 DIAGNOSIS — M4726 Other spondylosis with radiculopathy, lumbar region: Secondary | ICD-10-CM | POA: Diagnosis present

## 2018-11-07 DIAGNOSIS — M4316 Spondylolisthesis, lumbar region: Secondary | ICD-10-CM

## 2018-11-07 DIAGNOSIS — Z8261 Family history of arthritis: Secondary | ICD-10-CM

## 2018-11-07 DIAGNOSIS — M5126 Other intervertebral disc displacement, lumbar region: Secondary | ICD-10-CM | POA: Diagnosis present

## 2018-11-07 DIAGNOSIS — M48062 Spinal stenosis, lumbar region with neurogenic claudication: Secondary | ICD-10-CM | POA: Diagnosis present

## 2018-11-07 HISTORY — PX: ABDOMINAL EXPOSURE: SHX5708

## 2018-11-07 HISTORY — PX: APPLICATION OF ROBOTIC ASSISTANCE FOR SPINAL PROCEDURE: SHX6753

## 2018-11-07 HISTORY — PX: ANTERIOR LATERAL LUMBAR FUSION WITH PERCUTANEOUS SCREW 3 LEVEL: SHX5555

## 2018-11-07 HISTORY — PX: ANTERIOR LUMBAR FUSION: SHX1170

## 2018-11-07 LAB — GLUCOSE, CAPILLARY
Glucose-Capillary: 191 mg/dL — ABNORMAL HIGH (ref 70–99)
Glucose-Capillary: 250 mg/dL — ABNORMAL HIGH (ref 70–99)
Glucose-Capillary: 268 mg/dL — ABNORMAL HIGH (ref 70–99)

## 2018-11-07 LAB — SARS CORONAVIRUS 2 BY RT PCR (HOSPITAL ORDER, PERFORMED IN ~~LOC~~ HOSPITAL LAB): SARS Coronavirus 2: NEGATIVE

## 2018-11-07 LAB — POCT I-STAT 4, (NA,K, GLUC, HGB,HCT)
Glucose, Bld: 268 mg/dL — ABNORMAL HIGH (ref 70–99)
HCT: 41 % (ref 39.0–52.0)
Hemoglobin: 13.9 g/dL (ref 13.0–17.0)
Potassium: 5.3 mmol/L — ABNORMAL HIGH (ref 3.5–5.1)
Sodium: 135 mmol/L (ref 135–145)

## 2018-11-07 SURGERY — ANTERIOR LUMBAR FUSION 2 LEVELS
Anesthesia: General | Site: Spine Lumbar

## 2018-11-07 MED ORDER — LEVOTHYROXINE SODIUM 75 MCG PO TABS
75.0000 ug | ORAL_TABLET | Freq: Every day | ORAL | Status: DC
  Administered 2018-11-08 – 2018-11-12 (×5): 75 ug via ORAL
  Filled 2018-11-07 (×5): qty 1

## 2018-11-07 MED ORDER — METOPROLOL TARTRATE 50 MG PO TABS
ORAL_TABLET | ORAL | Status: AC
  Filled 2018-11-07: qty 1

## 2018-11-07 MED ORDER — ACETAMINOPHEN 325 MG PO TABS: 650.0000 mg | ORAL_TABLET | ORAL | Status: DC | PRN

## 2018-11-07 MED ORDER — DIPHENHYDRAMINE HCL 50 MG/ML IJ SOLN
INTRAMUSCULAR | Status: AC
  Filled 2018-11-07: qty 1

## 2018-11-07 MED ORDER — LIDOCAINE-EPINEPHRINE 1 %-1:100000 IJ SOLN
INTRAMUSCULAR | Status: DC | PRN
  Administered 2018-11-07: 3 mL
  Administered 2018-11-07: 5 mL
  Administered 2018-11-07: 5.5 mL

## 2018-11-07 MED ORDER — ONDANSETRON HCL 4 MG/2ML IJ SOLN
INTRAMUSCULAR | Status: DC | PRN
  Administered 2018-11-07: 4 mg via INTRAVENOUS

## 2018-11-07 MED ORDER — AZELASTINE HCL 0.1 % NA SOLN
2.0000 | Freq: Two times a day (BID) | NASAL | Status: DC | PRN
  Filled 2018-11-07: qty 30

## 2018-11-07 MED ORDER — ACETAMINOPHEN 650 MG RE SUPP: 650.0000 mg | RECTAL | Status: DC | PRN

## 2018-11-07 MED ORDER — DEXMEDETOMIDINE HCL IN NACL 200 MCG/50ML IV SOLN
INTRAVENOUS | Status: AC
  Filled 2018-11-07: qty 50

## 2018-11-07 MED ORDER — MORPHINE SULFATE (PF) 2 MG/ML IV SOLN
2.0000 mg | INTRAVENOUS | Status: DC | PRN
  Administered 2018-11-07: 2 mg via INTRAVENOUS

## 2018-11-07 MED ORDER — FLUTICASONE PROPIONATE 50 MCG/ACT NA SUSP
1.0000 | Freq: Every day | NASAL | Status: DC | PRN
  Filled 2018-11-07: qty 16

## 2018-11-07 MED ORDER — DIPHENHYDRAMINE HCL 25 MG PO CAPS: 25.0000 mg | ORAL_CAPSULE | Freq: Every day | ORAL | Status: DC | PRN

## 2018-11-07 MED ORDER — PROMETHAZINE HCL 25 MG/ML IJ SOLN
INTRAMUSCULAR | Status: AC
  Filled 2018-11-07: qty 1

## 2018-11-07 MED ORDER — KETOROLAC TROMETHAMINE 15 MG/ML IJ SOLN
INTRAMUSCULAR | Status: AC
  Administered 2018-11-07: 7.5 mg via INTRAVENOUS
  Filled 2018-11-07: qty 1

## 2018-11-07 MED ORDER — LIDOCAINE 2% (20 MG/ML) 5 ML SYRINGE
INTRAMUSCULAR | Status: DC | PRN
  Administered 2018-11-07: 100 mg via INTRAVENOUS

## 2018-11-07 MED ORDER — METHOCARBAMOL 500 MG PO TABS
ORAL_TABLET | ORAL | Status: AC
  Filled 2018-11-07: qty 1

## 2018-11-07 MED ORDER — MIDAZOLAM HCL 2 MG/2ML IJ SOLN
INTRAMUSCULAR | Status: AC
  Filled 2018-11-07: qty 2

## 2018-11-07 MED ORDER — FENTANYL CITRATE (PF) 250 MCG/5ML IJ SOLN
INTRAMUSCULAR | Status: AC
  Filled 2018-11-07: qty 10

## 2018-11-07 MED ORDER — POLYETHYLENE GLYCOL 3350 17 G PO PACK
17.0000 g | PACK | Freq: Every day | ORAL | Status: DC | PRN
  Administered 2018-11-09 – 2018-11-10 (×2): 17 g via ORAL
  Filled 2018-11-07 (×2): qty 1

## 2018-11-07 MED ORDER — BUPIVACAINE HCL (PF) 0.5 % IJ SOLN
INTRAMUSCULAR | Status: DC | PRN
  Administered 2018-11-07: 5.5 mL
  Administered 2018-11-07: 3 mL
  Administered 2018-11-07: 5 mL

## 2018-11-07 MED ORDER — DIPHENHYDRAMINE HCL 50 MG/ML IJ SOLN
INTRAMUSCULAR | Status: DC | PRN
  Administered 2018-11-07: 25 mg via INTRAVENOUS

## 2018-11-07 MED ORDER — PANTOPRAZOLE SODIUM 40 MG PO TBEC
40.0000 mg | DELAYED_RELEASE_TABLET | Freq: Every day | ORAL | Status: DC
  Administered 2018-11-08 – 2018-11-12 (×5): 40 mg via ORAL
  Filled 2018-11-07 (×5): qty 1

## 2018-11-07 MED ORDER — SODIUM CHLORIDE 0.9% FLUSH
3.0000 mL | INTRAVENOUS | Status: DC | PRN
  Administered 2018-11-07: 3 mL via INTRAVENOUS
  Filled 2018-11-07: qty 3

## 2018-11-07 MED ORDER — CYCLOBENZAPRINE HCL 10 MG PO TABS
10.0000 mg | ORAL_TABLET | Freq: Every day | ORAL | Status: DC
  Administered 2018-11-07 – 2018-11-11 (×5): 10 mg via ORAL
  Filled 2018-11-07 (×6): qty 1

## 2018-11-07 MED ORDER — METFORMIN HCL 500 MG PO TABS
500.0000 mg | ORAL_TABLET | Freq: Two times a day (BID) | ORAL | Status: DC
  Administered 2018-11-08 – 2018-11-10 (×6): 500 mg via ORAL
  Filled 2018-11-07 (×6): qty 1

## 2018-11-07 MED ORDER — CHLORHEXIDINE GLUCONATE CLOTH 2 % EX PADS: 6.0000 | MEDICATED_PAD | Freq: Once | CUTANEOUS | Status: DC

## 2018-11-07 MED ORDER — THROMBIN 5000 UNITS EX SOLR
CUTANEOUS | Status: AC
  Filled 2018-11-07: qty 5000

## 2018-11-07 MED ORDER — HYDROMORPHONE HCL 1 MG/ML IJ SOLN
INTRAMUSCULAR | Status: AC
  Filled 2018-11-07: qty 1

## 2018-11-07 MED ORDER — ACETAMINOPHEN 500 MG PO TABS
1000.0000 mg | ORAL_TABLET | Freq: Once | ORAL | Status: AC
  Administered 2018-11-07: 1000 mg via ORAL
  Filled 2018-11-07: qty 2

## 2018-11-07 MED ORDER — INSULIN ASPART 100 UNIT/ML ~~LOC~~ SOLN
0.0000 [IU] | SUBCUTANEOUS | Status: DC
  Administered 2018-11-07: 11 [IU] via SUBCUTANEOUS
  Administered 2018-11-08: 4 [IU] via SUBCUTANEOUS
  Administered 2018-11-08: 3 [IU] via SUBCUTANEOUS
  Administered 2018-11-08 (×2): 4 [IU] via SUBCUTANEOUS
  Administered 2018-11-08: 7 [IU] via SUBCUTANEOUS
  Administered 2018-11-09: 4 [IU] via SUBCUTANEOUS
  Administered 2018-11-09 (×2): 7 [IU] via SUBCUTANEOUS
  Administered 2018-11-09: 4 [IU] via SUBCUTANEOUS
  Administered 2018-11-09: 7 [IU] via SUBCUTANEOUS
  Administered 2018-11-09: 4 [IU] via SUBCUTANEOUS
  Administered 2018-11-10: 7 [IU] via SUBCUTANEOUS
  Administered 2018-11-10: 4 [IU] via SUBCUTANEOUS
  Administered 2018-11-10 (×2): 7 [IU] via SUBCUTANEOUS
  Administered 2018-11-10 – 2018-11-12 (×9): 4 [IU] via SUBCUTANEOUS

## 2018-11-07 MED ORDER — ALUM & MAG HYDROXIDE-SIMETH 200-200-20 MG/5ML PO SUSP: 30.0000 mL | Freq: Four times a day (QID) | ORAL | Status: DC | PRN

## 2018-11-07 MED ORDER — VITAMIN D 25 MCG (1000 UNIT) PO TABS
5000.0000 [IU] | ORAL_TABLET | Freq: Every day | ORAL | Status: DC
  Administered 2018-11-07 – 2018-11-08 (×2): 5000 [IU] via ORAL
  Filled 2018-11-07 (×5): qty 5

## 2018-11-07 MED ORDER — DOCUSATE SODIUM 100 MG PO CAPS
100.0000 mg | ORAL_CAPSULE | Freq: Two times a day (BID) | ORAL | Status: DC
  Administered 2018-11-07 – 2018-11-12 (×10): 100 mg via ORAL
  Filled 2018-11-07 (×9): qty 1

## 2018-11-07 MED ORDER — CHLORHEXIDINE GLUCONATE 4 % EX LIQD: 60.0000 mL | Freq: Once | CUTANEOUS | Status: DC

## 2018-11-07 MED ORDER — METHOCARBAMOL 500 MG PO TABS
500.0000 mg | ORAL_TABLET | Freq: Four times a day (QID) | ORAL | Status: DC | PRN
  Administered 2018-11-07 – 2018-11-10 (×7): 500 mg via ORAL
  Filled 2018-11-07 (×7): qty 1

## 2018-11-07 MED ORDER — ADULT MULTIVITAMIN W/MINERALS CH
1.0000 | ORAL_TABLET | Freq: Every day | ORAL | Status: DC
  Administered 2018-11-08 – 2018-11-12 (×5): 1 via ORAL
  Filled 2018-11-07 (×5): qty 1

## 2018-11-07 MED ORDER — KETOROLAC TROMETHAMINE 15 MG/ML IJ SOLN
7.5000 mg | Freq: Four times a day (QID) | INTRAMUSCULAR | Status: AC
  Administered 2018-11-07 – 2018-11-08 (×4): 7.5 mg via INTRAVENOUS
  Filled 2018-11-07 (×4): qty 1

## 2018-11-07 MED ORDER — PHENYLEPHRINE 40 MCG/ML (10ML) SYRINGE FOR IV PUSH (FOR BLOOD PRESSURE SUPPORT)
PREFILLED_SYRINGE | INTRAVENOUS | Status: AC
  Filled 2018-11-07: qty 10

## 2018-11-07 MED ORDER — LACTATED RINGERS IV SOLN
INTRAVENOUS | Status: DC | PRN
  Administered 2018-11-07: 08:00:00 via INTRAVENOUS

## 2018-11-07 MED ORDER — HYDROMORPHONE HCL 1 MG/ML IJ SOLN
INTRAMUSCULAR | Status: DC | PRN
  Administered 2018-11-07: 1 mg via INTRAVENOUS

## 2018-11-07 MED ORDER — PHENYLEPHRINE 40 MCG/ML (10ML) SYRINGE FOR IV PUSH (FOR BLOOD PRESSURE SUPPORT)
PREFILLED_SYRINGE | INTRAVENOUS | Status: DC | PRN
  Administered 2018-11-07 (×2): 80 ug via INTRAVENOUS
  Administered 2018-11-07: 40 ug via INTRAVENOUS
  Administered 2018-11-07: 80 ug via INTRAVENOUS
  Administered 2018-11-07: 40 ug via INTRAVENOUS

## 2018-11-07 MED ORDER — MENTHOL 3 MG MT LOZG: 1.0000 | LOZENGE | OROMUCOSAL | Status: DC | PRN

## 2018-11-07 MED ORDER — FENTANYL CITRATE (PF) 100 MCG/2ML IJ SOLN
25.0000 ug | INTRAMUSCULAR | Status: DC | PRN
  Administered 2018-11-07: 50 ug via INTRAVENOUS

## 2018-11-07 MED ORDER — TAMSULOSIN HCL 0.4 MG PO CAPS
0.4000 mg | ORAL_CAPSULE | Freq: Every evening | ORAL | Status: DC
  Administered 2018-11-08 – 2018-11-11 (×4): 0.4 mg via ORAL
  Filled 2018-11-07 (×4): qty 1

## 2018-11-07 MED ORDER — VITAMIN D3 25 MCG (1000 UNIT) PO TABS
5000.0000 [IU] | ORAL_TABLET | Freq: Every day | ORAL | Status: DC
  Administered 2018-11-07 – 2018-11-12 (×6): 5000 [IU] via ORAL
  Filled 2018-11-07 (×4): qty 5

## 2018-11-07 MED ORDER — FENTANYL CITRATE (PF) 100 MCG/2ML IJ SOLN
INTRAMUSCULAR | Status: DC | PRN
  Administered 2018-11-07: 150 ug via INTRAVENOUS
  Administered 2018-11-07 (×3): 100 ug via INTRAVENOUS
  Administered 2018-11-07: 150 ug via INTRAVENOUS
  Administered 2018-11-07: 100 ug via INTRAVENOUS
  Administered 2018-11-07: 50 ug via INTRAVENOUS

## 2018-11-07 MED ORDER — BISACODYL 10 MG RE SUPP
10.0000 mg | Freq: Every day | RECTAL | Status: DC | PRN
  Administered 2018-11-09: 10 mg via RECTAL
  Filled 2018-11-07: qty 1

## 2018-11-07 MED ORDER — THROMBIN 5000 UNITS EX SOLR
OROMUCOSAL | Status: DC | PRN
  Administered 2018-11-07 (×2): 5 mL via TOPICAL

## 2018-11-07 MED ORDER — METOPROLOL SUCCINATE ER 25 MG PO TB24: 50.0000 mg | ORAL_TABLET | Freq: Once | ORAL | Status: DC

## 2018-11-07 MED ORDER — LIDOCAINE 2% (20 MG/ML) 5 ML SYRINGE
INTRAMUSCULAR | Status: AC
  Filled 2018-11-07: qty 10

## 2018-11-07 MED ORDER — FENTANYL CITRATE (PF) 250 MCG/5ML IJ SOLN
INTRAMUSCULAR | Status: AC
  Filled 2018-11-07: qty 5

## 2018-11-07 MED ORDER — LACTATED RINGERS IV SOLN
INTRAVENOUS | Status: DC | PRN
  Administered 2018-11-07 (×3): via INTRAVENOUS

## 2018-11-07 MED ORDER — PROMETHAZINE HCL 25 MG/ML IJ SOLN: 6.2500 mg | INTRAMUSCULAR | Status: DC | PRN

## 2018-11-07 MED ORDER — SUGAMMADEX SODIUM 200 MG/2ML IV SOLN
INTRAVENOUS | Status: DC | PRN
  Administered 2018-11-07: 100 mg via INTRAVENOUS

## 2018-11-07 MED ORDER — KETAMINE HCL 50 MG/5ML IJ SOSY
PREFILLED_SYRINGE | INTRAMUSCULAR | Status: AC
  Filled 2018-11-07: qty 10

## 2018-11-07 MED ORDER — BENAZEPRIL HCL 20 MG PO TABS
10.0000 mg | ORAL_TABLET | Freq: Every day | ORAL | Status: DC
  Administered 2018-11-08 – 2018-11-12 (×5): 10 mg via ORAL
  Filled 2018-11-07 (×5): qty 1

## 2018-11-07 MED ORDER — PROPOFOL 10 MG/ML IV BOLUS
INTRAVENOUS | Status: AC
  Filled 2018-11-07: qty 20

## 2018-11-07 MED ORDER — LACTATED RINGERS IV SOLN: INTRAVENOUS | Status: DC

## 2018-11-07 MED ORDER — FLEET ENEMA 7-19 GM/118ML RE ENEM
1.0000 | ENEMA | Freq: Once | RECTAL | Status: AC | PRN
  Administered 2018-11-10: 1 via RECTAL
  Filled 2018-11-07: qty 1

## 2018-11-07 MED ORDER — PHENYLEPHRINE 40 MCG/ML (10ML) SYRINGE FOR IV PUSH (FOR BLOOD PRESSURE SUPPORT)
PREFILLED_SYRINGE | INTRAVENOUS | Status: AC
  Filled 2018-11-07: qty 20

## 2018-11-07 MED ORDER — SODIUM CHLORIDE 0.9 % IV SOLN
INTRAVENOUS | Status: DC | PRN
  Administered 2018-11-07: 50 ug/min via INTRAVENOUS

## 2018-11-07 MED ORDER — ROCURONIUM BROMIDE 10 MG/ML (PF) SYRINGE
PREFILLED_SYRINGE | INTRAVENOUS | Status: AC
  Filled 2018-11-07: qty 30

## 2018-11-07 MED ORDER — THROMBIN 20000 UNITS EX SOLR
CUTANEOUS | Status: AC
  Filled 2018-11-07: qty 20000

## 2018-11-07 MED ORDER — KETAMINE HCL 50 MG/ML IJ SOLN
INTRAMUSCULAR | Status: DC | PRN
  Administered 2018-11-07 (×5): 20 mg via INTRAMUSCULAR

## 2018-11-07 MED ORDER — BUPIVACAINE HCL (PF) 0.5 % IJ SOLN
INTRAMUSCULAR | Status: AC
  Filled 2018-11-07: qty 30

## 2018-11-07 MED ORDER — ONDANSETRON HCL 4 MG/2ML IJ SOLN
INTRAMUSCULAR | Status: AC
  Filled 2018-11-07: qty 2

## 2018-11-07 MED ORDER — 0.9 % SODIUM CHLORIDE (POUR BTL) OPTIME
TOPICAL | Status: DC | PRN
  Administered 2018-11-07: 1000 mL

## 2018-11-07 MED ORDER — PREGABALIN 100 MG PO CAPS
100.0000 mg | ORAL_CAPSULE | Freq: Two times a day (BID) | ORAL | Status: DC
  Administered 2018-11-07 – 2018-11-12 (×9): 100 mg via ORAL
  Filled 2018-11-07 (×9): qty 1

## 2018-11-07 MED ORDER — ADULT MULTIVITAMIN W/MINERALS CH
1.0000 | ORAL_TABLET | Freq: Every day | ORAL | Status: DC
  Filled 2018-11-07 (×2): qty 1

## 2018-11-07 MED ORDER — DEXMEDETOMIDINE HCL IN NACL 200 MCG/50ML IV SOLN
INTRAVENOUS | Status: DC | PRN
  Administered 2018-11-07 (×4): 8 ug via INTRAVENOUS

## 2018-11-07 MED ORDER — SCOPOLAMINE 1 MG/3DAYS TD PT72
1.0000 | MEDICATED_PATCH | TRANSDERMAL | Status: DC
  Administered 2018-11-07: 1.5 mg via TRANSDERMAL
  Filled 2018-11-07: qty 1

## 2018-11-07 MED ORDER — EPHEDRINE SULFATE 50 MG/ML IJ SOLN
INTRAMUSCULAR | Status: DC | PRN
  Administered 2018-11-07: 5 mg via INTRAVENOUS

## 2018-11-07 MED ORDER — LIDOCAINE-EPINEPHRINE 1 %-1:100000 IJ SOLN
INTRAMUSCULAR | Status: AC
  Filled 2018-11-07: qty 1

## 2018-11-07 MED ORDER — ONDANSETRON HCL 4 MG PO TABS: 4.0000 mg | ORAL_TABLET | Freq: Four times a day (QID) | ORAL | Status: DC | PRN

## 2018-11-07 MED ORDER — DEXAMETHASONE SODIUM PHOSPHATE 10 MG/ML IJ SOLN
INTRAMUSCULAR | Status: DC | PRN
  Administered 2018-11-07: 8 mg via INTRAVENOUS

## 2018-11-07 MED ORDER — METOPROLOL SUCCINATE ER 25 MG PO TB24
ORAL_TABLET | ORAL | Status: AC
  Filled 2018-11-07: qty 2

## 2018-11-07 MED ORDER — INSULIN ASPART 100 UNIT/ML FLEXPEN: 8.0000 [IU] | PEN_INJECTOR | Freq: Two times a day (BID) | SUBCUTANEOUS | Status: DC

## 2018-11-07 MED ORDER — PROPOFOL 10 MG/ML IV BOLUS
INTRAVENOUS | Status: DC | PRN
  Administered 2018-11-07: 200 mg via INTRAVENOUS

## 2018-11-07 MED ORDER — SODIUM CHLORIDE 0.9 % IV SOLN: 250.0000 mL | INTRAVENOUS | Status: DC

## 2018-11-07 MED ORDER — OXYCODONE-ACETAMINOPHEN 5-325 MG PO TABS
1.0000 | ORAL_TABLET | ORAL | Status: DC | PRN
  Administered 2018-11-07 – 2018-11-12 (×16): 2 via ORAL
  Filled 2018-11-07 (×15): qty 2

## 2018-11-07 MED ORDER — METHOCARBAMOL 1000 MG/10ML IJ SOLN
500.0000 mg | Freq: Four times a day (QID) | INTRAVENOUS | Status: DC | PRN
  Filled 2018-11-07: qty 5

## 2018-11-07 MED ORDER — ATORVASTATIN CALCIUM 10 MG PO TABS
20.0000 mg | ORAL_TABLET | Freq: Every day | ORAL | Status: DC
  Administered 2018-11-08 – 2018-11-12 (×5): 20 mg via ORAL
  Filled 2018-11-07 (×5): qty 2

## 2018-11-07 MED ORDER — ALBUMIN HUMAN 5 % IV SOLN
INTRAVENOUS | Status: DC | PRN
  Administered 2018-11-07 (×2): via INTRAVENOUS

## 2018-11-07 MED ORDER — METOPROLOL SUCCINATE ER 50 MG PO TB24
50.0000 mg | ORAL_TABLET | Freq: Every evening | ORAL | Status: DC
  Administered 2018-11-08 – 2018-11-11 (×4): 50 mg via ORAL
  Filled 2018-11-07 (×4): qty 1

## 2018-11-07 MED ORDER — MORPHINE SULFATE (PF) 4 MG/ML IV SOLN
INTRAVENOUS | Status: AC
  Filled 2018-11-07: qty 1

## 2018-11-07 MED ORDER — NORTRIPTYLINE HCL 10 MG PO CAPS
10.0000 mg | ORAL_CAPSULE | Freq: Every day | ORAL | Status: DC
  Administered 2018-11-07 – 2018-11-11 (×5): 10 mg via ORAL
  Filled 2018-11-07 (×6): qty 1

## 2018-11-07 MED ORDER — PHENOL 1.4 % MT LIQD: 1.0000 | OROMUCOSAL | Status: DC | PRN

## 2018-11-07 MED ORDER — OXYCODONE-ACETAMINOPHEN 5-325 MG PO TABS
ORAL_TABLET | ORAL | Status: AC
  Administered 2018-11-07: 2 via ORAL
  Filled 2018-11-07: qty 2

## 2018-11-07 MED ORDER — FENTANYL CITRATE (PF) 100 MCG/2ML IJ SOLN
INTRAMUSCULAR | Status: AC
  Filled 2018-11-07: qty 2

## 2018-11-07 MED ORDER — MIDAZOLAM HCL 5 MG/5ML IJ SOLN
INTRAMUSCULAR | Status: DC | PRN
  Administered 2018-11-07: 2 mg via INTRAVENOUS

## 2018-11-07 MED ORDER — SODIUM CHLORIDE 0.9 % IV SOLN
INTRAVENOUS | Status: DC | PRN
  Administered 2018-11-07: 500 mL

## 2018-11-07 MED ORDER — ROCURONIUM BROMIDE 50 MG/5ML IV SOSY
PREFILLED_SYRINGE | INTRAVENOUS | Status: DC | PRN
  Administered 2018-11-07: 50 mg via INTRAVENOUS
  Administered 2018-11-07: 100 mg via INTRAVENOUS
  Administered 2018-11-07: 20 mg via INTRAVENOUS

## 2018-11-07 MED ORDER — SODIUM CHLORIDE 0.9% FLUSH
3.0000 mL | Freq: Two times a day (BID) | INTRAVENOUS | Status: DC
  Administered 2018-11-07 – 2018-11-12 (×8): 3 mL via INTRAVENOUS

## 2018-11-07 MED ORDER — ONDANSETRON HCL 4 MG/2ML IJ SOLN: 4.0000 mg | Freq: Four times a day (QID) | INTRAMUSCULAR | Status: DC | PRN

## 2018-11-07 MED ORDER — DEXAMETHASONE SODIUM PHOSPHATE 10 MG/ML IJ SOLN
INTRAMUSCULAR | Status: AC
  Filled 2018-11-07: qty 1

## 2018-11-07 MED ORDER — SENNA 8.6 MG PO TABS
1.0000 | ORAL_TABLET | Freq: Two times a day (BID) | ORAL | Status: DC
  Administered 2018-11-07 – 2018-11-12 (×10): 8.6 mg via ORAL
  Filled 2018-11-07 (×9): qty 1

## 2018-11-07 SURGICAL SUPPLY — 98 items
ADH SKN CLS APL DERMABOND .7 (GAUZE/BANDAGES/DRESSINGS) ×6
APPLIER CLIP 11 MED OPEN (CLIP) ×4
APR CLP MED 11 20 MLT OPN (CLIP) ×2
BAG DECANTER FOR FLEXI CONT (MISCELLANEOUS) ×4 IMPLANT
BASE TI IMPLANT 6X38X28 10D (Neuro Prosthesis/Implant) ×2 IMPLANT
BASE TI IMPLANT 8X38X28 15DEG (Neuro Prosthesis/Implant) ×2 IMPLANT
BASKET BONE COLLECTION (BASKET) IMPLANT
BIT DRILL LONG 3.0X30 (BIT) IMPLANT
BIT DRILL LONG 3.0X30MM (BIT)
BIT DRILL LONG 3X80 (BIT) IMPLANT
BIT DRILL LONG 3X80MM (BIT)
BIT DRILL LONG 4X80 (BIT) IMPLANT
BIT DRILL LONG 4X80MM (BIT)
BIT DRILL SHORT 3.0X30 (BIT) IMPLANT
BIT DRILL SHORT 3.0X30MM (BIT)
BIT DRILL SHORT 3X80 (BIT) IMPLANT
BIT DRILL SHORT 3X80MM (BIT)
BLADE SURG 11 STRL SS (BLADE) ×2 IMPLANT
BOLT BASE TI 5X17.5 VARIABLE (Bolt) ×4 IMPLANT
BOLT BASE TI 5X20 VARIABLE (Bolt) ×8 IMPLANT
BONE MATRIX OSTEOCEL PRO MED (Bone Implant) ×6 IMPLANT
BUR BARREL STRAIGHT FLUTE 4.0 (BURR) ×2 IMPLANT
BUR MATCHSTICK NEURO 3.0 LAGG (BURR) ×2 IMPLANT
CANISTER SUCT 3000ML PPV (MISCELLANEOUS) ×4 IMPLANT
CLIP APPLIE 11 MED OPEN (CLIP) ×4 IMPLANT
CLIP NEUROVISION LG (CLIP) ×2 IMPLANT
CLOSURE WOUND 1/2 X4 (GAUZE/BANDAGES/DRESSINGS)
DERMABOND ADVANCED (GAUZE/BANDAGES/DRESSINGS) ×6
DERMABOND ADVANCED .7 DNX12 (GAUZE/BANDAGES/DRESSINGS) ×2 IMPLANT
DRAPE C-ARM 42X72 X-RAY (DRAPES) ×6 IMPLANT
DRAPE C-ARMOR (DRAPES) ×2 IMPLANT
DRAPE LAPAROTOMY 100X72X124 (DRAPES) ×4 IMPLANT
DRAPE SHEET LG 3/4 BI-LAMINATE (DRAPES) ×4 IMPLANT
DURAPREP 26ML APPLICATOR (WOUND CARE) ×4 IMPLANT
ELECT BLADE 4.0 EZ CLEAN MEGAD (MISCELLANEOUS) ×8
ELECT REM PT RETURN 9FT ADLT (ELECTROSURGICAL) ×4
ELECTRODE BLDE 4.0 EZ CLN MEGD (MISCELLANEOUS) ×4 IMPLANT
ELECTRODE REM PT RTRN 9FT ADLT (ELECTROSURGICAL) ×2 IMPLANT
GAUZE 4X4 16PLY RFD (DISPOSABLE) IMPLANT
GAUZE SPONGE 4X4 12PLY STRL (GAUZE/BANDAGES/DRESSINGS) ×4 IMPLANT
GLOVE BIOGEL PI IND STRL 8.5 (GLOVE) ×2 IMPLANT
GLOVE BIOGEL PI INDICATOR 8.5 (GLOVE) ×6
GLOVE ECLIPSE 8.5 STRL (GLOVE) ×10 IMPLANT
GLOVE SS BIOGEL STRL SZ 7.5 (GLOVE) ×2 IMPLANT
GLOVE SUPERSENSE BIOGEL SZ 7.5 (GLOVE) ×4
GOWN STRL REUS W/ TWL LRG LVL3 (GOWN DISPOSABLE) ×2 IMPLANT
GOWN STRL REUS W/ TWL XL LVL3 (GOWN DISPOSABLE) ×2 IMPLANT
GOWN STRL REUS W/TWL 2XL LVL3 (GOWN DISPOSABLE) ×10 IMPLANT
GOWN STRL REUS W/TWL LRG LVL3 (GOWN DISPOSABLE) ×16
GOWN STRL REUS W/TWL XL LVL3 (GOWN DISPOSABLE) ×12
GUIDEWIRE NITINOL BEVEL TIP (WIRE) ×16 IMPLANT
HEMOSTAT POWDER KIT SURGIFOAM (HEMOSTASIS) ×4 IMPLANT
INSERT FOGARTY 61MM (MISCELLANEOUS) IMPLANT
INSERT FOGARTY SM (MISCELLANEOUS) IMPLANT
KIT BASIN OR (CUSTOM PROCEDURE TRAY) ×4 IMPLANT
KIT DILATOR XLIF 5 (KITS) IMPLANT
KIT SPINE MAZOR X ROBO DISP (MISCELLANEOUS) ×4 IMPLANT
KIT SURGICAL ACCESS MAXCESS 4 (KITS) ×2 IMPLANT
KIT TURNOVER KIT B (KITS) ×4 IMPLANT
KIT XLIF (KITS) ×2
LOOP VESSEL MAXI BLUE (MISCELLANEOUS) IMPLANT
LOOP VESSEL MINI RED (MISCELLANEOUS) IMPLANT
MODULE NVM5 NEXT GEN EMG (NEEDLE) ×2 IMPLANT
MODULUS XLW 12X22X55MM 10 (Spine Construct) ×2 IMPLANT
NDL HYPO 25X1 1.5 SAFETY (NEEDLE) IMPLANT
NDL I-PASS III (NEEDLE) IMPLANT
NDL SPNL 18GX3.5 QUINCKE PK (NEEDLE) ×2 IMPLANT
NDL SPNL 20GX3.5 QUINCKE YW (NEEDLE) IMPLANT
NEEDLE HYPO 25X1 1.5 SAFETY (NEEDLE) ×4 IMPLANT
NEEDLE I-PASS III (NEEDLE) ×4 IMPLANT
NEEDLE SPNL 18GX3.5 QUINCKE PK (NEEDLE) ×4 IMPLANT
NEEDLE SPNL 20GX3.5 QUINCKE YW (NEEDLE) ×4 IMPLANT
NS IRRIG 1000ML POUR BTL (IV SOLUTION) ×4 IMPLANT
PACK LAMINECTOMY NEURO (CUSTOM PROCEDURE TRAY) ×4 IMPLANT
PAD ARMBOARD 7.5X6 YLW CONV (MISCELLANEOUS) ×12 IMPLANT
PIN HEAD 2.5X60MM (PIN) IMPLANT
ROD RELINE MAS LORD 5.5X85MM (Rod) ×4 IMPLANT
SCREW LOCK RELINE 5.5 TULIP (Screw) ×16 IMPLANT
SCREW MAS RELINE 6.5X45 POLY (Screw) ×16 IMPLANT
SCREW SCHANZ SA 4.0MM (MISCELLANEOUS) ×2 IMPLANT
SPONGE INTESTINAL PEANUT (DISPOSABLE) ×10 IMPLANT
SPONGE LAP 18X18 RF (DISPOSABLE) ×4 IMPLANT
SPONGE LAP 4X18 RFD (DISPOSABLE) IMPLANT
STRIP CLOSURE SKIN 1/2X4 (GAUZE/BANDAGES/DRESSINGS) ×4 IMPLANT
SUT SILK 0 TIES 10X30 (SUTURE) ×4 IMPLANT
SUT SILK 2 0 TIES 10X30 (SUTURE) ×4 IMPLANT
SUT SILK 3 0 TIES 10X30 (SUTURE) ×4 IMPLANT
SUT VIC AB 0 CT1 27 (SUTURE) ×4
SUT VIC AB 0 CT1 27XBRD ANBCTR (SUTURE) ×2 IMPLANT
SUT VIC AB 1 CT1 18XBRD ANBCTR (SUTURE) ×2 IMPLANT
SUT VIC AB 1 CT1 8-18 (SUTURE) ×4
SUT VIC AB 2-0 CP2 18 (SUTURE) ×8 IMPLANT
SUT VIC AB 3-0 SH 8-18 (SUTURE) ×8 IMPLANT
TOWEL GREEN STERILE (TOWEL DISPOSABLE) ×6 IMPLANT
TOWEL GREEN STERILE FF (TOWEL DISPOSABLE) ×8 IMPLANT
TRAY FOLEY MTR SLVR 16FR STAT (SET/KITS/TRAYS/PACK) ×4 IMPLANT
TUBE MAZOR SA REDUCTION (TUBING) ×2 IMPLANT
WATER STERILE IRR 1000ML POUR (IV SOLUTION) ×6 IMPLANT

## 2018-11-07 NOTE — Op Note (Signed)
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 Postoperative diagnosis: Same Procedure: Anterior lumbar decompression and arthrodesis with titanium interbody spacer measuring 8 x 38 x 28 mm and 15 degrees lordosis at L4-5 and 6 x 38 x 28 mm with 10 degrees lordosis at L5-S1 Surgeon: Kristeen Miss Approach: Curt Jews MD Anesthesia: General endotracheal Indications: Tony Christensen is a 58 year old individual whose had significant back and bilateral lower extremity pain has had previous discectomies at L4-5 and L5-S1.  He has had progressive stenosis in the lateral recesses and has advanced spondylosis at L3 445 and 5 1.  I have advised an anterior lumbar interbody arthrodesis at L4-5 and L5-S1 which is now being performed stage II will be an anterolateral decompression at L3-4 and the final stage will be posterior fixation.  Procedure: The patient was brought to the operating room placed on table in supine position after the smooth induction of general endotracheal anesthesia and placement of Foley catheter the anterior portion of the abdomen was shaved prepped with alcohol DuraPrep and draped in a sterile fashion fluoroscopic guidance was used to localize on the surface of the skin the trajectory for L4-5 and L5-S1.  Then Dr. Donnetta Hutching performed the approach to the retroperitoneal space at L4-5 and L5-S1 securing a Thompson retractor at the L4-5 space first.  I then proceeded to open the anterior portion of the disc with #15 blade and a combination of curettes and rongeurs was used to evacuate a substantial quantity of severely degenerated and desiccated disc material.  As the region of the posterior longitudinal ligament was reached careful dissection was performed to remove subligamentous disc material and some osteophytes from the inferior margin of the body of L4.  Interspace was enlarged slightly and the endplates were smoothed with a 4 mm  barrel bit.  Then a series of trials was used to see which interspace would fit best and it was felt that a 8 mm tall 38 x 28 mm dimension spacer with 15 degrees of lordosis would fit best L4-5.  This was then secured with 5.0 x 20 mm screws to be in place superiorly and one being placed inferiorly.  Once this was secured and fluoroscopic imaging was obtained attention was then turned to L5-S1 and a Thompson retractors were repositioned in this region.  The same procedure was repeated opening the anterior longitudinal ligament removing all the degenerated disc and some posterior osteophytes.  Interspace was sized here it was felt that a 6 mm tall 38 x 28 mm size spacer with 10 degrees of lordosis would fit best into this interval here to 5.0 x 17 mils millimeters screws were placed into S1 and one 5.0 x 20 mm screw was placed into L5.  Once this was secured the retractors were removed final radiographs were obtained and then I closed the anterior rectus sheath with a running 0 Prolene suture.  2-0 Vicryl was then used to close subcutaneous tissues and 3-0 Vicryl to close the subcuticular layer deeply and a 4-0 Vicryl was used in a running fashion as a subcuticular closure on the skin the skin was then dressed with Dermabond.  Blood loss for this portion of the procedure was approximately 75 cc.

## 2018-11-07 NOTE — Op Note (Signed)
    OPERATIVE REPORT  DATE OF SURGERY: 11/07/2018  PATIENT: Tony Christensen, 58 y.o. male MRN: [id number]  DOB: January 06, 1961  PRE-OPERATIVE DIAGNOSIS: Degenerative disc disease  POST-OPERATIVE DIAGNOSIS:  Same  PROCEDURE: Anterior exposure for L4-5 and L5-S1 disc fusion  SURGEON:  Curt Jews, M.D.  Co-surgeon for the exposure Dr. Kristeen Miss  ANESTHESIA: General  EBL: per anesthesia record  Total I/O In: 2000 [I.V.:1500; IV Piggyback:500] Out: 818 [Urine:300; Blood:175]  BLOOD ADMINISTERED: none  DRAINS: none  SPECIMEN: none  COUNTS CORRECT:  YES  PATIENT DISPOSITION:  PACU - hemodynamically stable  PROCEDURE DETAILS: The patient was taken to the operating placed supine position where the abdomen was prepped and draped in the usual sterile fashion.  Crosstable lateral C arm was brought onto the field demonstrate the level of the L4-5 and L5-S1 disc on the surface of the abdomen.  This area was marked.  A left paramedian incision was made over this level approximately the midline of the rectus muscle.  The anterior rectus sheath was opened in line with the skin incision.  The rectus muscle was mobilized and retracted laterally.  The retroperitoneum was entered bluntly below the semilunar line and the intraperitoneal contents were mobilized to the right.  The posterior rectus sheath was opened laterally to give better proximal exposure.  Dissection was continued over the level of the psoas muscle.  The 5 1 disc was approached first between the level of the iliac vessels.  The middle sacral vessels were clipped and divided.  Blunt dissection over the 5 1 was continued to give adequate exposure for fusion.  Next the iliac vessels were rolled to the left and the 4 5 disc was exposed.  This was accomplished by ligating the iliolumbar vein to give continued mobilization of the iliac vessels and aorta to the right.  The Thompson retractor was brought onto the field and the reverse lip 150  blades were positioned to the right and left of the L4-5 disc and malleable retractors were used for superior exposure.  Spinal needle was placed in the 4 5 disc and C-arm was brought back onto the field to confirm this was the appropriate level.  The remainder of the procedure will be dictated as a separate note by Dr. Magda Kiel, M.D., [facility] 11/07/2018 1:28 PM

## 2018-11-07 NOTE — Progress Notes (Signed)
Patient ID: Tony Christensen, male   DOB: [birth year], 58 y.o.   MRN: [id number] Vital signs are stable Motor function appears intact in the lower extremities Patient is awakening nicely Have discussed the surgical findings with the patient's wife.

## 2018-11-07 NOTE — Op Note (Signed)
Date of surgery: 11/07/2018 Preoperative diagnosis: Lumbar spondylosis and stenosis L3-L4 Postoperative diagnosis: Same Procedure: Anterolateral decompression of L3-L4 using lateral placed cage at L3-4 with ostia cell allograft.  EMG monitoring. Surgeon: Kristeen Miss Anesthesia: General endotracheal Indications: This is a stage II of a three-part operation being performed on Tony Christensen for significant spondylosis and stenosis at L3-4 L4-5 and L5-S1.  He had decompression and fusion at L4-5 and L5-S1 via an anterior technique and a lateral technique is being used at L3-4 to decompress and stabilize this using an ex lift type spacer.  Allograft arthrodesis is being performed with ostia cell.  Procedure: The patient was placed from the supine position into the right lateral decubitus position and fluoroscopic guidance was used to check his position was orthogonal on the operating table when this was noted then the patient was taped with tape over the posterior suprailiac crest on the left side and over the chest wall superiorly.  Then the legs were held in position after EMG monitoring was performed.  Next the L3-4 space was visualized fluoroscopically on the lateral view and the skin above this area was marked for approach into the interspace with a lateral approach.  The skin was prepped with alcohol DuraPrep and draped in a sterile fashion.  A vertical incision was made over the chosen area and this was dissected down to the subcutaneous tissues.  Then a second posterior incision was created and this was then bluntly probed until the retroperitoneal space was reached and could be palpated with the index finger.  This was then used as a guide to place a probe through the lateral incision into the retroperitoneal space and down through the psoas muscle which could be palpated.  EMG monitoring was performed to make sure that none of the neural elements were nearby when this was verified and the probe was  noted to be over the interspace at L3-4 a K wire was placed to secure it a series of dilators was then placed over this initial probe to dilate to a 15 mm diameter and 160 mm retractor was then placed over this widest dilation all the time checking EMG monitoring for any neural interference none was noted.  With the retractor minimally enlarged the posterior aspect of the retraction site was checked for EMG activity when none was noted a shim was placed into the posterior aspect of the disc space.  Once this was secured to the operating table with a clamp a discectomy was performed opening the lateral portion of the longitudinal ligament and entering the disc space and removing a substantial quantity of severely degenerated desiccated disc material.  Once this was performed a series of dilators was passed to allow placement of a series of spacers ultimately was felt that a 12 mm tall 22 x 55 mm spacer with 10 degrees of lordosis would fit best into the L3-4 space these endplates were decorticated amply and once this was verified the interbody spacer was placed under direct and fluoroscopic visualization.  Once its placement was secured in the AP and lateral projections the retractor was removed and then the soup superficial fascia was closed with 2-0 Vicryl and 3-0 Vicryl was used in the subcuticular skin on both incisions.  Blood loss for this portion of the procedure was estimated less than 50 mL.  The patient then had a surgery continued to the third portion which was a posterior fixation.  This is dictated separately

## 2018-11-07 NOTE — Op Note (Signed)
Date of surgery: 11/07/2018 Preoperative diagnosis: Spondylosis and stenosis L3-4 L4-5 L5-S1 with chronic radiculopathy and neurogenic claudication.  History of herniated nucleus pulposus Postoperative diagnosis: Same Surgeon: Kristeen Miss First assistant: Newman Pies, MD Procedure: Posterior stabilization with percutaneously placed pedicle screws using fluoroscopic guidance and EMG monitoring Indications: The patient has had a 3 stage procedure today and this is part 3 of the third stage to provide posterior fixation with pedicle screws.  It was initially planned that this would be done with robotic assistance however after failure of the robot to be able to localize the pedicles properly we proceeded to abandon that technique and proceeded with fluoroscopic visualization for placement of percutaneous pedicle screws.  Procedure: Patient was turned from the lateral position onto the prone position on the [place] table.  After padding the bony prominences appropriately of the back was prepped with alcohol DuraPrep and draped in a sterile fashion.  Initially the Mazor robot was attached to the table.  And after several attempts at obtaining registration localization after placing a Steinmann pin in the left posterior superior iliac crest the robot technique was abandoned and the robot was removed from the table.  Then using fluoroscopic guidance pedicle entry sites were chosen at L3-L4-L5 and S1 using external fluoroscopic landmarks and placing Jamshidi needles into the pedicles with EMG monitoring.  The Jamshidi needles were advanced into the vertebral body and K wires were placed to each of the Jamshidi cannula.  Then using EMG monitoring 6.5 x 45 mm screws were placed into L3-L4-L5 and the sacrum.  No evidence of cut out was identified and fluoroscopic visualization identified good position of the hardware.  Then 85 mm precontoured rods were placed between the screw heads using the percutaneous technique  and the rod was placed into the cradles of the screws using the towers available on each of the screws.  The screws were then tightened to the rod with the appropriate torque.  This was done first on the right side and then on the left side.  The towers were then removed and final radiographs were obtained in AP and lateral projection.  For the entirety of the procedure 300 cc of blood loss was registered and 100 cc of Cell Saver blood was returned to the patient.  After the towers were removed hemostasis and the wounds was checked and 2-0 Vicryl was used in the subcutaneous tissues and 3-0 Vicryl was used subcuticularly to close the skin where the stab incisions had been made for placement of the pedicle screws.  The shin screw that was placed in the left posterior suprailiac crest was also removed and a singular 3-0 Vicryl was used to close that incision.

## 2018-11-07 NOTE — Anesthesia Procedure Notes (Signed)
Procedure Name: Intubation Date/Time: 11/07/2018 7:52 AM Performed by: Mariea Clonts, CRNA Pre-anesthesia Checklist: Patient identified, Emergency Drugs available, Suction available and Patient being monitored Patient Re-evaluated:Patient Re-evaluated prior to induction Oxygen Delivery Method: Circle System Utilized Preoxygenation: Pre-oxygenation with 100% oxygen Induction Type: IV induction and Cricoid Pressure applied Ventilation: Mask ventilation without difficulty, Oral airway inserted - appropriate to patient size and Mask ventilation throughout procedure Laryngoscope Size: Miller and 2 Grade View: Grade II Tube type: Oral Tube size: 7.5 mm Number of attempts: 2 Airway Equipment and Method: Stylet and Oral airway Placement Confirmation: ETT inserted through vocal cords under direct vision,  positive ETCO2 and breath sounds checked- equal and bilateral Secured at: 23 cm Tube secured with: Tape Dental Injury: Teeth and Oropharynx as per pre-operative assessment

## 2018-11-07 NOTE — Anesthesia Procedure Notes (Signed)
Arterial Line Insertion Start/End6/22/2020 7:10 AM, 11/07/2018 7:15 AM Performed by: CRNA  Patient location: Pre-op. Preanesthetic checklist: patient identified, IV checked, site marked, risks and benefits discussed, surgical consent, monitors and equipment checked, pre-op evaluation, timeout performed and anesthesia consent Lidocaine 1% used for infiltration Left, radial was placed Catheter size: 20 G Hand hygiene performed  and maximum sterile barriers used   Attempts: 1 Procedure performed without using ultrasound guided technique. Following insertion, dressing applied and Biopatch. Post procedure assessment: normal  Patient tolerated the procedure well with no immediate complications.

## 2018-11-07 NOTE — H&P (Signed)
Tony Christensen is an 58 y.o. male.   Chief Complaint: Back pain bilateral leg weakness and pain HPI: Tony Christensen is a 58 year old individual whose had previous disc herniation at L4-5 and has had some advanced disc degenerative changes at L3-4 L4-5 and L5-S1 he has moderately severe stenosis at these levels in addition as his condition has evolved over the last several years.  He has been having increasing pain and now weakness in his legs.  I noted that he would ultimately require 3 levels of decompression and fusion of his lumbar spine he is now admitted for this procedure to have an anterior lumbar interbody arthrodesis at L4-5 and L5-S1 and an ex lift at L3-4 with percutaneous screw fixation from L3 to the sacrum.  Past Medical History:  Diagnosis Date  . Arthritis   . 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 Diabetes mellitus without complication ([place])   . GERD (gastroesophageal reflux disease)   . Heart murmur   . History of kidney stones   . Hypertension   . Hypothyroidism   . Kidney cysts   . Kidney stones   . Sleep apnea   . Thyroid disease     Past Surgical History:  Procedure Laterality Date  . BACK SURGERY    . BLADDER TUMOR EXCISION    . CARPAL TUNNEL WITH CUBITAL TUNNEL Bilateral   . KNEE ARTHROSCOPY W/ MENISCAL REPAIR Right   . NECK SURGERY      Family History  Problem Relation Age of Onset  . Fibromyalgia Mother   . Arthritis Mother   . Brain cancer Father   . Lung cancer Father   . Hypertension Father   . Heart disease Father    Social History:  reports that he has quit smoking. He has never used smokeless tobacco. He reports current alcohol use. He reports that he does not use drugs.  Allergies:  Allergies  Allergen Reactions  . Ceclor [Cefaclor] Anaphylaxis    Medications Prior to Admission  Medication Sig Dispense Refill  . atorvastatin (LIPITOR) 20 MG tablet Take 20 mg by mouth daily.    . benazepril (LOTENSIN) 10 MG tablet Take 10 mg by mouth daily.      . Cholecalciferol (VITAMIN D-3) 5000 units TABS Take 5,000 Units by mouth daily.     . cyclobenzaprine (FLEXERIL) 10 MG tablet Take 10 mg by mouth at bedtime.     . diphenhydrAMINE (BENADRYL) 25 mg capsule Take 25-50 mg by mouth daily as needed for allergies.     . HONEY PO Take 15 mLs by mouth every morning.    Marland Kitchen levothyroxine (SYNTHROID, LEVOTHROID) 75 MCG tablet Take 75 mcg by mouth daily before breakfast.     . meloxicam (MOBIC) 7.5 MG tablet Take 7.5 mg by mouth daily.    . metFORMIN (GLUCOPHAGE) 500 MG tablet Take 500 mg by mouth 2 (two) times daily with a meal.    . metoprolol succinate (TOPROL-XL) 50 MG 24 hr tablet Take 50 mg by mouth every evening. Take with or immediately following a meal.    . Multiple Vitamins-Minerals (MULTIVITAMIN WITH MINERALS) tablet Take 1 tablet by mouth daily.    . nortriptyline (PAMELOR) 10 MG capsule Take 10 mg by mouth at bedtime.    Marland Kitchen NOVOLOG FLEXPEN 100 UNIT/ML FlexPen Inject 8-22 Units into the skin 2 (two) times daily with a meal. Takes with meals as needed    . omeprazole (PRILOSEC) 20 MG capsule Take 20 mg by mouth  every morning.    . pregabalin (LYRICA) 100 MG capsule Take 100 mg by mouth 2 (two) times daily.     . tamsulosin (FLOMAX) 0.4 MG CAPS capsule Take 0.4 mg by mouth every evening.    Marland Kitchen azelastine (ASTELIN) 0.1 % nasal spray Place 2 sprays into both nostrils 2 (two) times daily as needed for allergies.     . fluticasone (FLONASE) 50 MCG/ACT nasal spray Place 1 spray into both nostrils daily as needed for allergies.     . magnesium chloride (SLOW-MAG) 64 MG TBEC SR tablet Take 64 tablets by mouth daily.       Results for orders placed or performed during the hospital encounter of 11/07/18 (from the past 48 hour(s))  SARS Coronavirus 2 (CEPHEID - Performed in [place] hospital lab), Hosp Order     Status: None   Collection Time: 11/07/18  5:52 AM   Specimen: Nasopharyngeal Swab  Result Value Ref Range   SARS Coronavirus 2 NEGATIVE  NEGATIVE    Comment: (NOTE) If result is NEGATIVE SARS-CoV-2 target nucleic acids are NOT DETECTED. The SARS-CoV-2 RNA is generally detectable in upper and lower  respiratory specimens during the acute phase of infection. The lowest  concentration of SARS-CoV-2 viral copies this assay can detect is 250  copies / mL. A negative result does not preclude SARS-CoV-2 infection  and should not be used as the sole basis for treatment or other  patient management decisions.  A negative result may occur with  improper specimen collection / handling, submission of specimen other  than nasopharyngeal swab, presence of viral mutation(s) within the  areas targeted by this assay, and inadequate number of viral copies  (<250 copies / mL). A negative result must be combined with clinical  observations, patient history, and epidemiological information. If result is POSITIVE SARS-CoV-2 target nucleic acids are DETECTED. The SARS-CoV-2 RNA is generally detectable in upper and lower  respiratory specimens dur ing the acute phase of infection.  Positive  results are indicative of active infection with SARS-CoV-2.  Clinical  correlation with patient history and other diagnostic information is  necessary to determine patient infection status.  Positive results do  not rule out bacterial infection or co-infection with other viruses. If result is PRESUMPTIVE POSTIVE SARS-CoV-2 nucleic acids MAY BE PRESENT.   A presumptive positive result was obtained on the submitted specimen  and confirmed on repeat testing.  While 2019 novel coronavirus  (SARS-CoV-2) nucleic acids may be present in the submitted sample  additional confirmatory testing may be necessary for epidemiological  and / or clinical management purposes  to differentiate between  SARS-CoV-2 and other Sarbecovirus currently known to infect humans.  If clinically indicated additional testing with an alternate test  methodology [phone]) is advised. The  SARS-CoV-2 RNA is generally  detectable in upper and lower respiratory sp ecimens during the acute  phase of infection. The expected result is Negative. Fact Sheet for Patients:  StrictlyIdeas.no Fact Sheet for Healthcare Providers: BankingDealers.co.za This test is not yet approved or cleared by the [place] FDA and has been authorized for detection and/or diagnosis of SARS-CoV-2 by FDA under an Emergency Use Authorization (EUA).  This EUA will remain in effect (meaning this test can be used) for the duration of the COVID-19 declaration under Section 564(b)(1) of the Act, 21 U.S.C. section 360bbb-3(b)(1), unless the authorization is terminated or revoked sooner. Performed at [facility], [place] [address], [place], [place] [address]   Glucose, capillary  Status: Abnormal   Collection Time: 11/07/18  6:19 AM  Result Value Ref Range   Glucose-Capillary 191 (H) 70 - 99 mg/dL   No results found.  Review of Systems  Constitutional: Negative.   Cardiovascular: Negative.   Genitourinary: Negative.   Musculoskeletal: Positive for back pain.  Skin: Negative.   Neurological: Positive for tingling, sensory change, focal weakness and weakness.  Endo/Heme/Allergies: Negative.   Psychiatric/Behavioral: Negative.     Blood pressure (!) 175/90, pulse (!) 103, temperature 98.2 F (36.8 C), resp. rate 20, height 5\' 8"  (1.727 m), weight 96.8 kg, SpO2 99 %. Physical Exam  Constitutional: He is oriented to person, place, and time. He appears well-developed and well-nourished.  HENT:  Head: Normocephalic and atraumatic.  Eyes: Conjunctivae and EOM are normal.  Cardiovascular: Regular rhythm.  Respiratory: Breath sounds normal.  GI: Soft. Bowel sounds are normal.  Musculoskeletal:     Comments: Positive straight leg raising in either lower extremities at 30 degrees.  Patrick's maneuver is negative bilaterally.  Neurological: He is  alert and oriented to person, place, and time.  Motor strength reveals weakness in both tibialis anterior graded at 4- out of 5 both gastrocs graded 4 out of 5 tone and bulk appear normal deep tendon reflexes are absent in the patellae and the Achilles both.  Upper extremity reflexes are 1+ in the biceps and triceps 1+ in the brachioradialis.  Sensation appears diminished to pin and light touch and position in the distal lower extremities to the level of the knee  Skin: Skin is warm.     Assessment/Plan Spondylosis and stenosis with neurogenic claudication and lumbar radiculopathy L3-4 L4-5 and L5-S1.  History of previous surgeries L4-5 and L5-S1.  Plan: Anterior lumbar interbody arthrodesis L4-5 and L5-S1.  Anterolateral decompression L3-4.  Posterior percutaneous screw fixation L3 to sacrum.  Earleen Newport, MD 11/07/2018, 7:47 AM

## 2018-11-07 NOTE — Transfer of Care (Signed)
Immediate Anesthesia Transfer of Care Note  Patient: Tony Christensen  Procedure(s) Performed: LUMBAR FOUR-FIVE, LUMBAR FIVE-SACRAL ONE ANTERIOR LUMBAR INTERBODY FUSION (N/A Spine Lumbar) ANTERIOR LATERAL LUMBAR FUSION LUMBAR THREE-FOUR (N/A Spine Lumbar) PLACEMENT OF PERCUTANEOUS PEDICLE SCREWS LUMBAR THREE-SACRAL ONE WITH APPLICATION OF ROBOTIC ASSISTANCE (N/A Spine Lumbar) ABDOMINAL EXPOSURE (N/A )  Patient Location: PACU  Anesthesia Type:General  Level of Consciousness: awake, alert  and oriented  Airway & Oxygen Therapy: Patient Spontanous Breathing and Patient connected to face mask oxygen  Post-op Assessment: Report given to RN, Post -op Vital signs reviewed and stable and Patient moving all extremities X 4  Post vital signs: Reviewed and stable  Last Vitals:  Vitals Value Taken Time  BP 124/81 11/07/18 1623  Temp    Pulse 98 11/07/18 1626  Resp 12 11/07/18 1626  SpO2 93 % 11/07/18 1626  Vitals shown include unvalidated device data.  Last Pain:  Vitals:   11/07/18 0700  TempSrc:   PainSc: 7       Patients Stated Pain Goal: 3 (76/22/63 3354)  Complications: No apparent anesthesia complications

## 2018-11-08 ENCOUNTER — Encounter (HOSPITAL_COMMUNITY): Payer: Self-pay | Admitting: Neurological Surgery

## 2018-11-08 LAB — GLUCOSE, CAPILLARY
Glucose-Capillary: 110 mg/dL — ABNORMAL HIGH (ref 70–99)
Glucose-Capillary: 139 mg/dL — ABNORMAL HIGH (ref 70–99)
Glucose-Capillary: 159 mg/dL — ABNORMAL HIGH (ref 70–99)
Glucose-Capillary: 181 mg/dL — ABNORMAL HIGH (ref 70–99)
Glucose-Capillary: 183 mg/dL — ABNORMAL HIGH (ref 70–99)
Glucose-Capillary: 184 mg/dL — ABNORMAL HIGH (ref 70–99)
Glucose-Capillary: 217 mg/dL — ABNORMAL HIGH (ref 70–99)
Glucose-Capillary: 234 mg/dL — ABNORMAL HIGH (ref 70–99)

## 2018-11-08 LAB — CBC
HCT: 33.9 % — ABNORMAL LOW (ref 39.0–52.0)
Hemoglobin: 11.7 g/dL — ABNORMAL LOW (ref 13.0–17.0)
MCH: 31.2 pg (ref 26.0–34.0)
MCHC: 34.5 g/dL (ref 30.0–36.0)
MCV: 90.4 fL (ref 80.0–100.0)
Platelets: 206 10*3/uL (ref 150–400)
RBC: 3.75 MIL/uL — ABNORMAL LOW (ref 4.22–5.81)
RDW: 12 % (ref 11.5–15.5)
WBC: 9.2 10*3/uL (ref 4.0–10.5)
nRBC: 0 % (ref 0.0–0.2)

## 2018-11-08 LAB — BASIC METABOLIC PANEL
Anion gap: 9 (ref 5–15)
BUN: 18 mg/dL (ref 6–20)
CO2: 23 mmol/L (ref 22–32)
Calcium: 7.9 mg/dL — ABNORMAL LOW (ref 8.9–10.3)
Chloride: 103 mmol/L (ref 98–111)
Creatinine, Ser: 1.22 mg/dL (ref 0.61–1.24)
GFR calc Af Amer: >60 mL/min (ref >60)
GFR calc non Af Amer: >60 mL/min (ref >60)
Glucose, Bld: 140 mg/dL — ABNORMAL HIGH (ref 70–99)
Potassium: 3.9 mmol/L (ref 3.5–5.1)
Sodium: 135 mmol/L (ref 135–145)

## 2018-11-08 NOTE — Social Work (Addendum)
CSW acknowledging consult for SNF placement. At current time recommendations are for no PT f/u.   Westley Hummer, MSW, [place] Work [phone]

## 2018-11-08 NOTE — Progress Notes (Signed)
Patient ID: Tony Christensen, male   DOB: February 24, 1961, 58 y.o.   MRN: [id number] Vital signs are stable Motor function appears good Patient is ambulatory Complains of some right buttock pain Clinically doing well postop day 1 labs are stable with hemoglobin and renal function stable

## 2018-11-08 NOTE — Progress Notes (Signed)
Foley catheter removed at 0650. Patient is due to void. Elita Boone, BSN, RN

## 2018-11-08 NOTE — Progress Notes (Signed)
Patient ID: Tony Christensen, male   DOB: 05/21/1960, 58 y.o.   MRN: [id number] Reports typical soreness last evening and through the night. Comfortable this morning.  Sitting at the side of the bed with physical therapy.  No nausea or vomiting. Stable postop day 1.  Will not follow actively.  Please call if we can assist

## 2018-11-08 NOTE — Evaluation (Signed)
Occupational Therapy Evaluation Patient Details Name: Tony Christensen MRN: [id number] DOB: December 08, 1960 Today's Date: 11/08/2018    History of Present Illness Patient is a 58 y/o male who presents with 3 stage back surgery- L3-4, 4-5, L5-S1 fusion, anterior exposure. PMH includes multiple back surgeries, HTN, DM, ca, sleep apnea.   Clinical Impression   Patient is s/p L3-4 L4-5 L5-S1 fusiont surgery resulting in functional limitations due to the deficits listed below (see OT problem list). Pt with multiple back surgeries and able to verbalize precautiosn. Pt limited by pain and will need to increase bed mobility next session as precursor to adls.  Patient will benefit from skilled OT acutely to increase independence and safety with ADLS to allow discharge hhot.     Follow Up Recommendations  Home health OT    Equipment Recommendations  None recommended by OT    Recommendations for Other Services       Precautions / Restrictions Precautions Precautions: Back Precaution Booklet Issued: Yes (comment) Precaution Comments: Reviewed precautions can state 3 out 3  Required Braces or Orthoses: Spinal Brace Spinal Brace: Applied in sitting position(no brace present on eval) Restrictions Other Position/Activity Restrictions: Brace not arrived yet but RN aware. Dr. Ellene Route gave the go ahead for mobility wihtout the brace      Mobility Bed Mobility Overal bed mobility: Needs Assistance Bed Mobility: Sit to Supine       Sit to supine: Mod assist   General bed mobility comments: pt needs min cues for sequence and mod (A) to lift bil le onto bed surace  Transfers Overall transfer level: Needs assistance Equipment used: Rolling walker (2 wheeled) Transfers: Sit to/from Stand Sit to Stand: Min assist         General transfer comment: Light Min A to boost to standing, slow to rise.    Balance Overall balance assessment: Needs assistance;History of Falls Sitting-balance support:  Feet supported;Bilateral upper extremity supported   Sitting balance - Comments: Prefers UE support due to pain.   Standing balance support: During functional activity;Single extremity supported Standing balance-Leahy Scale: Fair Standing balance comment: Able to stand statically with 1 UE support but needs BUE support for walking                           ADL either performed or assessed with clinical judgement   ADL Overall ADL's : Needs assistance/impaired Eating/Feeding: Modified independent;Sitting   Grooming: Oral care;Modified independent;Sitting Grooming Details (indicate cue type and reason): requires all adl items within reach and sitting. pt static standing at sink for hand hygiene and requires 1 UE support for balance Upper Body Bathing: Minimal assistance   Lower Body Bathing: Total assistance           Toilet Transfer: Minimal assistance;RW;Gr[employer] Information systems manager Details (indicate cue type and reason): pt reports that he has standard commode at home but he can do it;          Functional mobility during ADLs: Min guard;Rolling walker General ADL Comments: pt in bathroom on arrival. pt complete transfer and hygiene of hands. pt positioned in the bed. pt reports this is his 63 th surgery and abl et o state 3 out 3 precautions     Vision         Perception     Praxis      Pertinent Vitals/Pain Pain Assessment: 0-10 Pain Score: 6  Pain Location: surgical site, BLEs, abdomen Pain Descriptors /  Indicators: Sore;Operative site guarding;Burning Pain Intervention(s): Monitored during session;Premedicated before session;Repositioned     Hand Dominance Right   Extremity/Trunk Assessment Upper Extremity Assessment Upper Extremity Assessment: Overall WFL for tasks assessed   Lower Extremity Assessment Lower Extremity Assessment: Defer to PT evaluation   Cervical / Trunk Assessment Cervical / Trunk Assessment: Other  exceptions Cervical / Trunk Exceptions: s/p spine surgery   Communication Communication Communication: No difficulties   Cognition Arousal/Alertness: Awake/alert Behavior During Therapy: WFL for tasks assessed/performed Overall Cognitive Status: Within Functional Limits for tasks assessed                                 General Comments: Reports being a stubborn man with a high pain tolerance.   General Comments       Exercises     Shoulder Instructions      Home Living Family/patient expects to be discharged to:: Private residence Living Arrangements: Spouse/significant other Available Help at Discharge: Family;Available 24 hours/day Type of Home: House Home Access: Level entry     Home Layout: One level;Laundry or work area in [place]: [place]: [employer] - single point          Prior Functioning/Environment Level of Independence: Independent        Comments: Used to work at [employer] but not since 1995. Drives. Loves to [employer] and work on cars. Wife has some physical disabilities so not able to help much        OT Problem List: Decreased strength;Decreased activity tolerance;Impaired balance (sitting and/or standing);Decreased safety awareness;Decreased knowledge of use of DME or AE;Decreased knowledge of precautions;Pain;Obesity      OT Treatment/Interventions: Self-care/ADL training;Therapeutic exercise;Neuromuscular education;Energy conservation;DME and/or AE instruction;Manual therapy;Therapeutic activities;Patient/family education;Balance training    OT Goals(Current goals can be found in the care plan section) Acute Rehab OT Goals Patient Stated Goal: to go home and wife can help since she is retired Therapist, sports OT Goal Formulation: With patient Time For Goal Achievement: 11/22/18 Potential to Achieve Goals: Good  OT Frequency: Min 3X/week   Barriers to D/C:            Co-evaluation               AM-PAC OT "6 Clicks" Daily Activity     Outcome Measure Help from another person eating meals?: None Help from another person taking care of personal grooming?: None Help from another person toileting, which includes using toliet, bedpan, or urinal?: A Little Help from another person bathing (including washing, rinsing, drying)?: A Lot Help from another person to put on and taking off regular upper body clothing?: A Little Help from another person to put on and taking off regular lower body clothing?: Total 6 Click Score: 17   End of Session Equipment Utilized During Treatment: Rolling walker Nurse Communication: Mobility status;Precautions  Activity Tolerance: Patient tolerated treatment well Patient left: in bed;with call bell/phone within reach  OT Visit Diagnosis: Unsteadiness on feet (R26.81);Muscle weakness (generalized) (M62.81)                Time: 4562-5638 OT Time Calculation (min): 21 min Charges:  OT General Charges $OT Visit: 1 Visit OT Evaluation $OT Eval Moderate Complexity: 1 Mod   Tony Christensen, OTR/L  Acute Rehabilitation Services Pager: [phone] Office: [phone] .   Tony Christensen 11/08/2018, 2:18 PM

## 2018-11-08 NOTE — Evaluation (Signed)
Physical Therapy Evaluation Patient Details Name: Tony Christensen MRN: [id number] DOB: Nov 19, 1960 Today's Date: 11/08/2018   History of Present Illness  Patient is a 58 y/o male who presents with 3 stage back surgery- L3-4, 4-5, L5-S1 fusion, anterior exposure. PMH includes multiple back surgeries, HTN, DM, ca, sleep apnea.  Clinical Impression  Patient presents with pain and post surgical deficits s/p above surgery. Pt independent PTA and lives with his wife (who needs 2 shoulder replacements). Today, pt tolerated bed mobility, transfers and gait training with Min guard-Min A for balance/safety. Gait limited due to pain. Pt reports numbness in BLEs. Education re: back precautions, log roll technique, positioning, walking etc. Brace ordered but has not arrived yet. Per [place], safe to get up without it. Will follow acutely to maximize independence and mobility prior to return home.    Follow Up Recommendations No PT follow up;Supervision - Intermittent    Equipment Recommendations  Rolling walker with 5" wheels    Recommendations for Other Services       Precautions / Restrictions Precautions Precautions: Back Precaution Booklet Issued: Yes (comment) Precaution Comments: Reviewed precautions Required Braces or Orthoses: Spinal Brace Spinal Brace: Applied in sitting position Restrictions Other Position/Activity Restrictions: Brace not arrived yet but RN aware. Dr. Ellene Route gave the go ahead for mobility wihtout the brace      Mobility  Bed Mobility Overal bed mobility: Needs Assistance Bed Mobility: Rolling;Sidelying to Sit Rolling: Min guard Sidelying to sit: HOB elevated       General bed mobility comments: Cues for log roll technique, use of rail and increased time due to pain.  Transfers Overall transfer level: Needs assistance Equipment used: Rolling walker (2 wheeled) Transfers: Sit to/from Stand Sit to Stand: Min assist         General transfer comment: Light Min  A to boost to standing, slow to rise.  Ambulation/Gait Ambulation/Gait assistance: Min guard Gait Distance (Feet): 20 Feet Assistive device: Rolling walker (2 wheeled) Gait Pattern/deviations: Step-through pattern;Decreased stride length;Decreased step length - left;Decreased step length - right Gait velocity: decreased   General Gait Details: Slow, guarded gait with RW for support; increased WB through [place].  Stairs            Wheelchair Mobility    Modified Rankin (Stroke Patients Only)       Balance Overall balance assessment: Needs assistance;History of Falls Sitting-balance support: Feet supported;Bilateral upper extremity supported   Sitting balance - Comments: Prefers UE support due to pain.   Standing balance support: During functional activity;Single extremity supported Standing balance-Leahy Scale: Fair Standing balance comment: Able to stand statically with 1 UE support but needs BUE support for walking                             Pertinent Vitals/Pain Pain Assessment: 0-10 Pain Score: 6  Pain Location: surgical site, BLEs, abdomen Pain Descriptors / Indicators: Sore;Operative site guarding;Burning Pain Intervention(s): Monitored during session;Repositioned    Home Living Family/patient expects to be discharged to:: Private residence Living Arrangements: Spouse/significant other Available Help at Discharge: Family;Available 24 hours/day Type of Home: House Home Access: Level entry     Home Layout: One level;Laundry or work area in [place]: [employer] - single point      Prior Function Level of Independence: Independent         Comments: Used to work at [employer] but not since 1995. Drives. Loves to [employer] and  work on cars. Wife has some physical disabilities so not able to help much     Hand Dominance        Extremity/Trunk Assessment   Upper Extremity Assessment Upper Extremity Assessment: Defer to OT evaluation     Lower Extremity Assessment Lower Extremity Assessment: Generalized weakness RLE Deficits / Details: tingling throughout extremity; grossly ~2+/5 knee extension, limited by nerve pain RLE Sensation: decreased light touch LLE Deficits / Details: tingling throughout extremity; ~2+/5 knee extension, limited by nerve pain LLE Sensation: decreased light touch    Cervical / Trunk Assessment Cervical / Trunk Assessment: Other exceptions Cervical / Trunk Exceptions: s/p spine surgery  Communication   Communication: No difficulties  Cognition Arousal/Alertness: Awake/alert Behavior During Therapy: WFL for tasks assessed/performed Overall Cognitive Status: Within Functional Limits for tasks assessed                                 General Comments: Reports being a stubborn man with a high pain tolerance.      General Comments General comments (skin integrity, edema, etc.): VSS throughout. Sp02 100% on RA.    Exercises     Assessment/Plan    PT Assessment Patient needs continued PT services  PT Problem List Decreased strength;Decreased mobility;Pain;Impaired sensation;Decreased balance;Decreased skin integrity;Decreased knowledge of precautions       PT Treatment Interventions Therapeutic activities;Gait training;Stair training;Balance training;Functional mobility training;Neuromuscular re-education;Therapeutic exercise;Patient/family education    PT Goals (Current goals can be found in the Care Plan section)  Acute Rehab PT Goals Patient Stated Goal: to get better and go home PT Goal Formulation: With patient Time For Goal Achievement: 11/22/18 Potential to Achieve Goals: Good    Frequency Min 5X/week   Barriers to discharge        Co-evaluation               AM-PAC PT "6 Clicks" Mobility  Outcome Measure Help needed turning from your back to your side while in a flat bed without using bedrails?: A Little Help needed moving from lying on your back to  sitting on the side of a flat bed without using bedrails?: A Little Help needed moving to and from a bed to a chair (including a wheelchair)?: A Little Help needed standing up from a chair using your arms (e.g., wheelchair or bedside chair)?: A Little Help needed to walk in hospital room?: A Little Help needed climbing 3-5 steps with a railing? : A Little 6 Click Score: 18    End of Session Equipment Utilized During Treatment: Gait belt Activity Tolerance: Patient tolerated treatment well Patient left: in chair;with call bell/phone within reach;with nursing/sitter in room Nurse Communication: Mobility status PT Visit Diagnosis: Pain;Muscle weakness (generalized) (M62.81);Unsteadiness on feet (R26.81);Difficulty in walking, not elsewhere classified (R26.2) Pain - Right/Left: (bil) Pain - part of body: Leg(back and abdomen)    Time: 0723-0759 PT Time Calculation (min) (ACUTE ONLY): 36 min   Charges:   PT Evaluation $PT Eval Low Complexity: 1 Low PT Treatments $Therapeutic Activity: 8-22 mins        Wray Kearns, PT, DPT Acute Rehabilitation Services Pager [phone] Office [place] 11/08/2018, 11:00 AM

## 2018-11-09 LAB — GLUCOSE, CAPILLARY
Glucose-Capillary: 169 mg/dL — ABNORMAL HIGH (ref 70–99)
Glucose-Capillary: 214 mg/dL — ABNORMAL HIGH (ref 70–99)
Glucose-Capillary: 200 mg/dL — ABNORMAL HIGH (ref 70–99)
Glucose-Capillary: 183 mg/dL — ABNORMAL HIGH (ref 70–99)
Glucose-Capillary: 168 mg/dL — ABNORMAL HIGH (ref 70–99)
Glucose-Capillary: 229 mg/dL — ABNORMAL HIGH (ref 70–99)

## 2018-11-09 MED ORDER — MAGNESIUM HYDROXIDE 400 MG/5ML PO SUSP
30.0000 mL | Freq: Every day | ORAL | Status: DC
  Administered 2018-11-09 – 2018-11-12 (×4): 30 mL via ORAL
  Filled 2018-11-09 (×4): qty 30

## 2018-11-09 NOTE — Progress Notes (Signed)
Physical Therapy Treatment Patient Details Name: Tony Christensen MRN: [id number] DOB: December 22, 1960 Today's Date: 11/09/2018    History of Present Illness Patient is a 58 y/o male who presents with 3 stage back surgery- L3-4, 4-5, L5-S1 fusion, anterior exposure. PMH includes multiple back surgeries, HTN, DM, ca, sleep apnea.    PT Comments    Pt with 8/10 abdominal pain limiting mobility. Pt did increased ambulation distance to 120' with RW, however with significant dependency on RW with UEs, minA to maintain fluid forward propulsion. Acute PT to cont to follow.    Follow Up Recommendations  No PT follow up;Supervision - Intermittent     Equipment Recommendations  Rolling walker with 5" wheels    Recommendations for Other Services       Precautions / Restrictions Precautions Precautions: Back Precaution Booklet Issued: Yes (comment) Precaution Comments: pt able to recall all precautions and adhere functionally Required Braces or Orthoses: Spinal Brace Spinal Brace: Applied in sitting position(min verbal cues for technique but able to complete w/o assis)    Mobility  Bed Mobility Overal bed mobility: Needs Assistance Bed Mobility: Rolling;Sidelying to Sit Rolling: Modified independent (Device/Increase time) Sidelying to sit: Modified independent (Device/Increase time)       General bed mobility comments: pt with increased time due to pain, definite use of bed rail, labored effort due to pain  Transfers Overall transfer level: Needs assistance Equipment used: Rolling walker (2 wheeled) Transfers: Sit to/from Stand Sit to Stand: Min assist         General transfer comment: minA to power up into standing and to steady during transition of hands from bed to RW  Ambulation/Gait Ambulation/Gait assistance: Min guard Gait Distance (Feet): 120 Feet Assistive device: Rolling walker (2 wheeled) Gait Pattern/deviations: Step-to pattern;Decreased stride length Gait velocity:  decreased Gait velocity interpretation: <1.31 ft/sec, indicative of household ambulator General Gait Details: Slow, guarded gait with RW for support; increased WB through BUEs.   Stairs             Wheelchair Mobility    Modified Rankin (Stroke Patients Only)       Balance Overall balance assessment: Needs assistance;History of Falls Sitting-balance support: No upper extremity supported;Feet supported Sitting balance-Leahy Scale: Good Sitting balance - Comments: able to don brace without assist   Standing balance support: During functional activity;Single extremity supported Standing balance-Leahy Scale: Fair Standing balance comment: dependent on RW for safe amb                            Cognition Arousal/Alertness: Awake/alert Behavior During Therapy: WFL for tasks assessed/performed Overall Cognitive Status: Within Functional Limits for tasks assessed                                 General Comments: pt with tangential speech      Exercises      General Comments General comments (skin integrity, edema, etc.): VSS      Pertinent Vitals/Pain Pain Assessment: 0-10 Pain Score: 8  Pain Location: abdominal Pain Descriptors / Indicators: Sore;Operative site guarding;Burning Pain Intervention(s): Monitored during session    Home Living                      Prior Function            PT Goals (current goals can now be found in the care  plan section) Acute Rehab PT Goals Patient Stated Goal: stop the abdominal pain Progress towards PT goals: Progressing toward goals    Frequency    Min 5X/week      PT Plan Current plan remains appropriate    Co-evaluation              AM-PAC PT "6 Clicks" Mobility   Outcome Measure  Help needed turning from your back to your side while in a flat bed without using bedrails?: A Little Help needed moving from lying on your back to sitting on the side of a flat bed without  using bedrails?: A Little Help needed moving to and from a bed to a chair (including a wheelchair)?: A Little Help needed standing up from a chair using your arms (e.g., wheelchair or bedside chair)?: A Little Help needed to walk in hospital room?: A Little Help needed climbing 3-5 steps with a railing? : A Little 6 Click Score: 18    End of Session Equipment Utilized During Treatment: Gait belt;Back brace Activity Tolerance: Patient tolerated treatment well Patient left: in chair;with call bell/phone within reach;with nursing/sitter in room Nurse Communication: Mobility status PT Visit Diagnosis: Pain;Muscle weakness (generalized) (M62.81);Unsteadiness on feet (R26.81);Difficulty in walking, not elsewhere classified (R26.2) Pain - part of body: (abdomen)     Time: 4580-9983 PT Time Calculation (min) (ACUTE ONLY): 24 min  Charges:  $Gait Training: 8-22 mins $Therapeutic Activity: 8-22 mins                     Kittie Plater, PT, DPT Acute Rehabilitation Services Pager #: [phone] Office #: [phone]    Berline Lopes 11/09/2018, 10:33 AM

## 2018-11-09 NOTE — TOC Initial Note (Signed)
Transition of Care [facility]) - Initial/Assessment Note    Patient Details  Name: Tony Christensen MRN: [id number] Date of Birth: Sep 14, 1960  [facility] CM/SW Contact:    Tony Christensen, [place] Phone Number: 11/09/2018, 12:23 PM  Clinical Narrative:                 CSW spoke with pt via telephone in pt room, introduced self, role, reason for call. Pt from home in [place], he lives with his wife Tony Christensen. Confirmed address and PCP. He is accepting of rolling walker but when asked about home health pt states he thinks he is okay at this time and is declining CSW to arrange [facility] services.  Pt is agreeable to PCP appointment being arranged.  Walker ordered, to be delivered to room prior to discharge (pt aware).  Expected Discharge Plan: Home/Self Care Barriers to Discharge: Continued Medical Work up   Patient Goals and CMS Choice Patient states their goals for this hospitalization and ongoing recovery are:: to get home and slowly build my strength back up   Choice offered to / list presented to : Patient  Expected Discharge Plan and Services Expected Discharge Plan: Home/Self Care In-house Referral: Clinical Social Work Discharge Planning Services: Follow-up appt scheduled Post Acute Care Choice: Durable Medical Equipment Living arrangements for the past 2 months: Single Family Home                 DME Arranged: Walker rolling DME Agency: [employer] Date DME Agency Contacted: 11/09/18 Time DME Agency Contacted: 47 Representative spoke with at DME Agency: [place] Arranged: Patient Refused [place]   Prior Living Arrangements/Services Living arrangements for the past 2 months: [name] Lives with:: Spouse Patient language and need for interpreter reviewed:: Yes(no needs) Do you feel safe going back to the place where you live?: Yes      Need for Family Participation in Patient Care: Yes (Comment)(support and assist as needed) Care giver support system in  place?: Yes (comment)(spouse; children)   Criminal Activity/Legal Involvement Pertinent to Current Situation/Hospitalization: No - Comment as needed  Activities of Daily Living Home Assistive Devices/Equipment: Eyeglasses, CBG Meter ADL Screening (condition at time of admission) Patient's cognitive ability adequate to safely complete daily activities?: Yes Is the patient deaf or have difficulty hearing?: No Does the patient have difficulty seeing, even when wearing glasses/contacts?: No Does the patient have difficulty concentrating, remembering, or making decisions?: No Patient able to express need for assistance with ADLs?: Yes Does the patient have difficulty dressing or bathing?: No Independently performs ADLs?: Yes (appropriate for developmental age) Does the patient have difficulty walking or climbing stairs?: No Weakness of Legs: Both Weakness of Arms/Hands: Both  Permission Sought/Granted Permission sought to share information with : Family Supports, PCP Permission granted to share information with : Yes, Verbal Permission Granted  Share Information with NAME: Tony Christensen  Permission granted to share info w AGENCY: PCP  Permission granted to share info w Relationship: wife  Permission granted to share info w Contact Information: [phone]  Emotional Assessment Appearance:: Other (Comment Required(assessment complete on phone) Attitude/Demeanor/Rapport: (assessment complete on phone) Affect (typically observed): (assessment complete on phone) Orientation: : Oriented to Self, Oriented to Place, Oriented to  Time, Oriented to Situation Alcohol / Substance Use: Alcohol Use, Not Applicable Psych Involvement: No (comment)  Admission diagnosis:  Spinal stenosis, Lumbar region with neurogenic claudication Patient Active Problem List   Diagnosis Date Noted  . Lumbar stenosis with neurogenic  claudication 11/07/2018   PCP:  Ocie Bob, FNP Pharmacy:   [facility] [address] [place] [place] [address] Phone: [phone] Fax: [phone]     Social Determinants of Health (SDOH) Interventions    Readmission Risk Interventions No flowsheet data found.

## 2018-11-09 NOTE — Progress Notes (Signed)
Occupational Therapy Treatment Patient Details Name: Tony Christensen MRN: [id number] DOB: 1961/05/17 Today's Date: 11/09/2018    History of present illness Patient is a 58 y/o male who presents with 3 stage back surgery- L3-4, 4-5, L5-S1 fusion, anterior exposure. PMH includes multiple back surgeries, HTN, DM, ca, sleep apnea.   OT comments  Pt with severe abdominal pain limiting session but patient did progress to movement with therapist. Pt total (A) for brace and (A) to elevate from bed surface.   Follow Up Recommendations  Home health OT    Equipment Recommendations  None recommended by OT    Recommendations for Other Services      Precautions / Restrictions Precautions Precautions: Back Required Braces or Orthoses: Spinal Brace Spinal Brace: Applied in sitting position       Mobility Bed Mobility Overal bed mobility: Needs Assistance Bed Mobility: Rolling;Supine to Sit;Sit to Supine Rolling: Min assist   Supine to sit: Mod assist Sit to supine: Mod assist   General bed mobility comments: pt requires (A) to elevate trunk from bed surface and (A) to lift bil LE back on bed surface  Transfers Overall transfer level: Needs assistance Equipment used: Rolling walker (2 wheeled) Transfers: Sit to/from Stand Sit to Stand: Mod assist         General transfer comment: bed elevated to help with transfer    Balance           Standing balance support: Bilateral upper extremity supported;During functional activity Standing balance-Leahy Scale: Poor                             ADL either performed or assessed with clinical judgement   ADL Overall ADL's : Needs assistance/impaired                                       General ADL Comments: total (A) to don brace in standing. pt with severe pain but continued despite severe pain. pt reports the pain appears to be more bowel related pushing on incision. pt progressed after x3 attempts to  standinga nd walking across the room to the window and back. pt with burping immediatly upon sitting up and without movement. RN made aware of patients pain but requesting assistance to void not pain medication.      Vision       Perception     Praxis      Cognition Arousal/Alertness: Awake/alert Behavior During Therapy: WFL for tasks assessed/performed Overall Cognitive Status: Within Functional Limits for tasks assessed                                          Exercises     Shoulder Instructions       General Comments      Pertinent Vitals/ Pain       Pain Assessment: 0-10 Pain Score: 10-Worst pain ever Pain Location: abdominal Pain Descriptors / Indicators: Sore;Operative site guarding;Burning Pain Intervention(s): Monitored during session;Premedicated before session;Repositioned;Ice applied  Home Living                                          Prior Functioning/Environment  Frequency  Min 3X/week        Progress Toward Goals  OT Goals(current goals can now be found in the care plan section)  Progress towards OT goals: Progressing toward goals  Acute Rehab OT Goals Patient Stated Goal: to void bowels OT Goal Formulation: With patient Time For Goal Achievement: 11/22/18 Potential to Achieve Goals: Good  Plan Discharge plan remains appropriate    Co-evaluation                 AM-PAC OT "6 Clicks" Daily Activity     Outcome Measure   Help from another person eating meals?: None Help from another person taking care of personal grooming?: None Help from another person toileting, which includes using toliet, bedpan, or urinal?: A Little Help from another person bathing (including washing, rinsing, drying)?: A Lot Help from another person to put on and taking off regular upper body clothing?: A Little Help from another person to put on and taking off regular lower body clothing?: Total 6 Click  Score: 17    End of Session Equipment Utilized During Treatment: Rolling walker;Back brace  OT Visit Diagnosis: Unsteadiness on feet (R26.81);Muscle weakness (generalized) (M62.81)   Activity Tolerance Patient tolerated treatment well   Patient Left in bed;with call bell/phone within reach;with bed alarm set   Nurse Communication Mobility status;Precautions        Time: [phone] OT Time Calculation (min): 12 min  Charges: OT General Charges $OT Visit: 1 Visit OT Evaluation $OT Eval Moderate Complexity: 1 Mod   Jeri Modena, OTR/L  Acute Rehabilitation Services Pager: [phone] Office: [phone] .    Jeri Modena 11/09/2018, 3:55 PM

## 2018-11-09 NOTE — Progress Notes (Signed)
Inpatient Diabetes Program Recommendations  AACE/ADA: New Consensus Statement on Inpatient Glycemic Control (2015)  Target Ranges:  Prepandial:   less than 140 mg/dL      Peak postprandial:   less than 180 mg/dL (1-2 hours)      Critically ill patients:  140 - 180 mg/dL   Lab Results  Component Value Date   GLUCAP 183 (H) 11/09/2018   HGBA1C 8.3 (H) 11/03/2018    Review of Glycemic Control Results for Tony Christensen, Tony Christensen (MRN [id number]) as of 11/09/2018 13:00  Ref. Range 11/09/2018 04:01 11/09/2018 07:57 11/09/2018 11:26 11/09/2018 12:12  Glucose-Capillary Latest Ref Range: 70 - 99 mg/dL 169 (H) 214 (H) 200 (H) 183 (H)   Diabetes history: Type 2 DM Outpatient Diabetes medications: Metformin 500 mg BID, Novolog 8-22 units BID Current orders for Inpatient glycemic control: Novolog 0-20 units Q4H, Metformin 500 mg BID  Inpatient Diabetes Program Recommendations:    Given glucose trends, patient could benefit from adding basal insulin. Consider Lantus 12 units QD.   Thanks, Bronson Curb, MSN, RNC-OB Diabetes Coordinator [phone] (8a-5p)

## 2018-11-09 NOTE — Progress Notes (Signed)
Patient ID: Tony Christensen, male   DOB: 10/28/1960, 58 y.o.   MRN: [id number] Patient is awake and alert His vital signs are stable Motor function is good but patient notes that right leg still feels numb Incisions are clean and dry Bowels have not moved and his ability to pass gas he notes is been very minimal He is not nauseated We will stimulate bowels with milk of magnesia Discussed consideration of discharge tomorrow Rolling walker order written

## 2018-11-10 ENCOUNTER — Other Ambulatory Visit: Payer: Self-pay

## 2018-11-10 LAB — GLUCOSE, CAPILLARY
Glucose-Capillary: 163 mg/dL — ABNORMAL HIGH (ref 70–99)
Glucose-Capillary: 168 mg/dL — ABNORMAL HIGH (ref 70–99)
Glucose-Capillary: 169 mg/dL — ABNORMAL HIGH (ref 70–99)
Glucose-Capillary: 192 mg/dL — ABNORMAL HIGH (ref 70–99)
Glucose-Capillary: 217 mg/dL — ABNORMAL HIGH (ref 70–99)
Glucose-Capillary: 217 mg/dL — ABNORMAL HIGH (ref 70–99)
Glucose-Capillary: 224 mg/dL — ABNORMAL HIGH (ref 70–99)

## 2018-11-10 LAB — URINALYSIS, ROUTINE W REFLEX MICROSCOPIC
Bacteria, UA: NONE SEEN
Bilirubin Urine: NEGATIVE
Glucose, UA: 150 mg/dL — AB
Ketones, ur: 20 mg/dL — AB
Leukocytes,Ua: NEGATIVE
Nitrite: NEGATIVE
Protein, ur: 30 mg/dL — AB
Specific Gravity, Urine: 1.014 (ref 1.005–1.030)
pH: 6 (ref 5.0–8.0)

## 2018-11-10 MED ORDER — FLEET ENEMA 7-19 GM/118ML RE ENEM
1.0000 | ENEMA | Freq: Every day | RECTAL | Status: DC | PRN
  Administered 2018-11-10: 1 via RECTAL
  Filled 2018-11-10: qty 1

## 2018-11-10 MED ORDER — CIPROFLOXACIN HCL 250 MG PO TABS
250.0000 mg | ORAL_TABLET | Freq: Two times a day (BID) | ORAL | Status: DC
  Administered 2018-11-10 – 2018-11-12 (×5): 250 mg via ORAL
  Filled 2018-11-10 (×5): qty 1

## 2018-11-10 NOTE — Progress Notes (Signed)
Dr. Ellene Route came to assess patient and advised to proceed with enema.  Will also collect UA with culture for urinary sxs. Will continue to monitor closely.

## 2018-11-10 NOTE — Progress Notes (Signed)
Patient still unable to have BM, had very small BM yesterday afternoon.  Patient complaining of 8/10 abd pain.  Patient's abd is significantly distended and taunt.  There is redness around the incision site on left lower abd.  There is also a large area of bruising on the right lower flank area.  Patient has an order for an enema, however I will hold until directed further by Dr. Ellene Route.  Spoke with Minette Brine at Dr. Clarice Pole office who will forward the message to him.

## 2018-11-10 NOTE — Progress Notes (Signed)
Patient ID: Tony Christensen, male   DOB: 08-Jan-1961, 58 y.o.   MRN: [id number] Vital signs are stable Patient's abdomen is markedly distended and he is very uncomfortable Percussion is very tympanic Patient had bowel stimulants yesterday but little results He will require an enema today I have encouraged him to walk and hold off on pain medication as much as he can in order to stimulate bowels His incisions are clean and dry otherwise some ecchymosis is noted on the lateral incision Hopefully once bowels move he will feel markedly better Is also concern for pain on urination and urinary tract infection We will start him on Cipro and send off a UA

## 2018-11-10 NOTE — Care Management Important Message (Signed)
Important Message  Patient Details  Name: Tony Christensen MRN: [id number] Date of Birth: May 24, 1960   Medicare Important Message Given:  Yes     Memory Argue 11/10/2018, 2:10 PM

## 2018-11-10 NOTE — Progress Notes (Signed)
Inpatient Diabetes Program Recommendations  AACE/ADA: New Consensus Statement on Inpatient Glycemic Control (2015)  Target Ranges:  Prepandial:   less than 140 mg/dL      Peak postprandial:   less than 180 mg/dL (1-2 hours)      Critically ill patients:  140 - 180 mg/dL   Lab Results  Component Value Date   GLUCAP 217 (H) 11/10/2018   HGBA1C 8.3 (H) 11/03/2018    Review of Glycemic Control Results for Tony Christensen, Tony Christensen (MRN [id number]) as of 11/10/2018 10:39  Ref. Range 11/09/2018 20:07 11/10/2018 00:09 11/10/2018 03:56 11/10/2018 08:11  Glucose-Capillary Latest Ref Range: 70 - 99 mg/dL 229 (H) 168 (H) 163 (H) 217 (H)   Diabetes history: Type 2 DM Outpatient Diabetes medications: Metformin 500 mg BID, Novolog 8-22 units BID Current orders for Inpatient glycemic control: Novolog 0-20 units Q4H, Metformin 500 mg BID  Inpatient Diabetes Program Recommendations:    Given glucose trends, patient could benefit from adding basal insulin. Consider Lantus 12 units QD.   Additionally, patient has diet order placed. Consider switching correction to Novolog 0-20 units TID and adding Novolog 0-5 units QHS.   Thanks, Bronson Curb, MSN, RNC-OB Diabetes Coordinator [phone] (8a-5p)

## 2018-11-10 NOTE — Progress Notes (Signed)
Per Dr. Ellene Route, can repeat enema if initial is ineffective.

## 2018-11-10 NOTE — Progress Notes (Signed)
Physical Therapy Treatment Patient Details Name: Tony Christensen MRN: [id number] DOB: 10-Nov-1960 Today's Date: 11/10/2018    History of Present Illness Patient is a 58 y/o male who presents with 3 stage back surgery- L3-4, 4-5, L5-S1 fusion, anterior exposure. PMH includes multiple back surgeries, HTN, DM, ca, sleep apnea.    PT Comments    Pt making progress towards his physical therapy goals, with increased ambulation distance to 250 feet with walker. Very slow speed but overall no gross instability. Donning brace without assist. Continues with abdominal pain related to constipation, given enema earlier. Will progress as tolerated.     Follow Up Recommendations  No PT follow up;Supervision - Intermittent     Equipment Recommendations  Rolling walker with 5" wheels    Recommendations for Other Services       Precautions / Restrictions Precautions Precautions: Back Precaution Booklet Issued: Yes (comment) Required Braces or Orthoses: Spinal Brace Spinal Brace: Applied in sitting position(min verbal cues for technique but able to complete w/o assis) Restrictions Weight Bearing Restrictions: No    Mobility  Bed Mobility               General bed mobility comments: OOB in chair  Transfers Overall transfer level: Needs assistance Equipment used: Rolling walker (2 wheeled) Transfers: Sit to/from Stand Sit to Stand: Min guard         General transfer comment: Increased time and effort, use of momentum  Ambulation/Gait Ambulation/Gait assistance: Min guard Gait Distance (Feet): 250 Feet Assistive device: Rolling walker (2 wheeled) Gait Pattern/deviations: Step-to pattern;Decreased stride length;Step-through pattern Gait velocity: decreased Gait velocity interpretation: <1.31 ft/sec, indicative of household ambulator General Gait Details: Very slow, guarded gait. Cues for scapular depression, step through pattern, activity pacing. Pt able to achieve step through  pattern towards end of walk. Limited by abdominal pain   Stairs             Wheelchair Mobility    Modified Rankin (Stroke Patients Only)       Balance Overall balance assessment: Needs assistance;History of Falls Sitting-balance support: No upper extremity supported;Feet supported Sitting balance-Leahy Scale: Good Sitting balance - Comments: able to don brace without assist   Standing balance support: During functional activity;Single extremity supported Standing balance-Leahy Scale: Fair Standing balance comment: dependent on RW for safe amb                            Cognition Arousal/Alertness: Awake/alert Behavior During Therapy: WFL for tasks assessed/performed Overall Cognitive Status: Within Functional Limits for tasks assessed                                 General Comments: pt with tangential speech      Exercises      General Comments        Pertinent Vitals/Pain Pain Assessment: Faces Faces Pain Scale: Hurts even more Pain Location: abdominal Pain Descriptors / Indicators: Operative site guarding;Burning;Grimacing Pain Intervention(s): Monitored during session    Home Living                      Prior Function            PT Goals (current goals can now be found in the care plan section) Acute Rehab PT Goals Patient Stated Goal: stop the abdominal pain Potential to Achieve Goals: Good Progress towards PT  goals: Progressing toward goals    Frequency    Min 5X/week      PT Plan Current plan remains appropriate    Co-evaluation              AM-PAC PT "6 Clicks" Mobility   Outcome Measure  Help needed turning from your back to your side while in a flat bed without using bedrails?: A Little Help needed moving from lying on your back to sitting on the side of a flat bed without using bedrails?: A Little Help needed moving to and from a bed to a chair (including a wheelchair)?: A Little Help  needed standing up from a chair using your arms (e.g., wheelchair or bedside chair)?: A Little Help needed to walk in hospital room?: A Little Help needed climbing 3-5 steps with a railing? : A Little 6 Click Score: 18    End of Session Equipment Utilized During Treatment: Gait belt;Back brace Activity Tolerance: Patient tolerated treatment well Patient left: with call bell/phone within reach;with nursing/sitter in room;in bed Nurse Communication: Mobility status PT Visit Diagnosis: Pain;Muscle weakness (generalized) (M62.81);Unsteadiness on feet (R26.81);Difficulty in walking, not elsewhere classified (R26.2) Pain - part of body: (abdomen)     Time: 7711-6579 PT Time Calculation (min) (ACUTE ONLY): 39 min  Charges:  $Gait Training: 23-37 mins $Therapeutic Activity: 8-22 mins                     Ellamae Sia, PT, DPT Acute Rehabilitation Services Pager [phone] Office [phone]    Willy Eddy 11/10/2018, 5:11 PM

## 2018-11-11 ENCOUNTER — Inpatient Hospital Stay (HOSPITAL_COMMUNITY): Payer: Medicare PPO

## 2018-11-11 LAB — GLUCOSE, CAPILLARY
Glucose-Capillary: 153 mg/dL — ABNORMAL HIGH (ref 70–99)
Glucose-Capillary: 168 mg/dL — ABNORMAL HIGH (ref 70–99)
Glucose-Capillary: 170 mg/dL — ABNORMAL HIGH (ref 70–99)
Glucose-Capillary: 174 mg/dL — ABNORMAL HIGH (ref 70–99)
Glucose-Capillary: 175 mg/dL — ABNORMAL HIGH (ref 70–99)
Glucose-Capillary: 196 mg/dL — ABNORMAL HIGH (ref 70–99)

## 2018-11-11 LAB — URINE CULTURE: Culture: NO GROWTH

## 2018-11-11 MED ORDER — IOPAMIDOL (ISOVUE-300) INJECTION 61%
100.0000 mL | Freq: Once | INTRAVENOUS | Status: AC | PRN
  Administered 2018-11-11: 100 mL via INTRAVENOUS

## 2018-11-11 MED ORDER — POLYETHYLENE GLYCOL 3350 17 G PO PACK
17.0000 g | PACK | Freq: Two times a day (BID) | ORAL | Status: DC
  Administered 2018-11-11 – 2018-11-12 (×3): 17 g via ORAL
  Filled 2018-11-11 (×2): qty 1

## 2018-11-11 MED ORDER — MAGNESIUM CITRATE PO SOLN
1.0000 | Freq: Every day | ORAL | Status: DC | PRN
  Administered 2018-11-11: 1 via ORAL
  Filled 2018-11-11: qty 296

## 2018-11-11 MED ORDER — SODIUM CHLORIDE 0.9 % IV SOLN
250.0000 mL | INTRAVENOUS | Status: AC
  Administered 2018-11-11: 250 mL via INTRAVENOUS

## 2018-11-11 MED FILL — Sodium Chloride IV Soln 0.9%: INTRAVENOUS | Qty: 1000 | Status: AC

## 2018-11-11 MED FILL — Heparin Sodium (Porcine) Inj 1000 Unit/ML: INTRAMUSCULAR | Qty: 30 | Status: AC

## 2018-11-11 NOTE — Anesthesia Postprocedure Evaluation (Signed)
Anesthesia Post Note  Patient: Tony Christensen  Procedure(s) Performed: LUMBAR FOUR-FIVE, LUMBAR FIVE-SACRAL ONE ANTERIOR LUMBAR INTERBODY FUSION (N/A Spine Lumbar) ANTERIOR LATERAL LUMBAR FUSION LUMBAR THREE-FOUR (N/A Spine Lumbar) PLACEMENT OF PERCUTANEOUS PEDICLE SCREWS LUMBAR THREE-SACRAL ONE WITH APPLICATION OF ROBOTIC ASSISTANCE (N/A Spine Lumbar) ABDOMINAL EXPOSURE (N/A )     Patient location during evaluation: PACU Anesthesia Type: General Level of consciousness: awake and alert Pain management: pain level controlled Vital Signs Assessment: post-procedure vital signs reviewed and stable Respiratory status: spontaneous breathing, nonlabored ventilation, respiratory function stable and patient connected to nasal cannula oxygen Cardiovascular status: blood pressure returned to baseline and stable Postop Assessment: no apparent nausea or vomiting Anesthetic complications: no    Last Vitals:  Vitals:   11/11/18 0746 11/11/18 1258  BP: (!) 144/90 (!) 160/85  Pulse: 94 100  Resp: 16 16  Temp: 36.8 C 36.9 C  SpO2: 98% 97%    Last Pain:  Vitals:   11/11/18 1258  TempSrc: Oral  PainSc:                  [name] S

## 2018-11-11 NOTE — Progress Notes (Signed)
Physical Therapy Treatment Patient Details Name: Tony Christensen MRN: [id number] DOB: March 03, 1961 Today's Date: 11/11/2018    History of Present Illness Patient is a 58 y/o male who presents with 3 stage back surgery- L3-4, 4-5, L5-S1 fusion, anterior exposure. PMH includes multiple back surgeries, HTN, DM, ca, sleep apnea.    PT Comments    Pt with increased abdominal pain, LLE spasm, still unable to have bowel movement. Agreeable to therapy, ambulating 250 feet with walker and min guard assist. Able to progress to step through pattern this session, continues with slow, guarded gait. Will continue to progress mobility as tolerated.     Follow Up Recommendations  No PT follow up;Supervision - Intermittent     Equipment Recommendations  Rolling walker with 5" wheels    Recommendations for Other Services       Precautions / Restrictions Precautions Precautions: Back Precaution Booklet Issued: Yes (comment) Required Braces or Orthoses: Spinal Brace Spinal Brace: Applied in sitting position(min verbal cues for technique but able to complete w/o assis) Restrictions Weight Bearing Restrictions: No    Mobility  Bed Mobility Overal bed mobility: Modified Independent             General bed mobility comments: No physical assist, increased time and effort  Transfers Overall transfer level: Needs assistance Equipment used: Rolling walker (2 wheeled) Transfers: Sit to/from Stand Sit to Stand: Min guard            Ambulation/Gait Ambulation/Gait assistance: Min guard Gait Distance (Feet): 250 Feet Assistive device: Rolling walker (2 wheeled) Gait Pattern/deviations: Decreased stride length;Step-through pattern;Decreased step length - right;Narrow base of support Gait velocity: decreased   General Gait Details: Cues for wider BOS, decreased L step length. Pt with guarded gait, increased scapular elevation, heavy reliance on walker   Stairs              Wheelchair Mobility    Modified Rankin (Stroke Patients Only)       Balance Overall balance assessment: Needs assistance;History of Falls Sitting-balance support: No upper extremity supported;Feet supported Sitting balance-Leahy Scale: Good     Standing balance support: During functional activity;Single extremity supported Standing balance-Leahy Scale: Fair Standing balance comment: Fair statically                             Cognition Arousal/Alertness: Awake/alert Behavior During Therapy: WFL for tasks assessed/performed Overall Cognitive Status: Within Functional Limits for tasks assessed                                 General Comments: pt with tangential speech      Exercises      General Comments        Pertinent Vitals/Pain Pain Assessment: Faces Faces Pain Scale: Hurts even more Pain Location: abdominal, LLE spasm Pain Descriptors / Indicators: Operative site guarding;Burning;Grimacing;Spasm Pain Intervention(s): Monitored during session    Home Living                      Prior Function            PT Goals (current goals can now be found in the care plan section) Acute Rehab PT Goals Patient Stated Goal: stop the abdominal pain Potential to Achieve Goals: Good Progress towards PT goals: Progressing toward goals    Frequency    Min 5X/week  PT Plan Current plan remains appropriate    Co-evaluation              AM-PAC PT "6 Clicks" Mobility   Outcome Measure  Help needed turning from your back to your side while in a flat bed without using bedrails?: A Little Help needed moving from lying on your back to sitting on the side of a flat bed without using bedrails?: A Little Help needed moving to and from a bed to a chair (including a wheelchair)?: A Little Help needed standing up from a chair using your arms (e.g., wheelchair or bedside chair)?: A Little Help needed to walk in hospital room?:  A Little Help needed climbing 3-5 steps with a railing? : A Little 6 Click Score: 18    End of Session Equipment Utilized During Treatment: Gait belt;Back brace Activity Tolerance: Patient tolerated treatment well Patient left: with call bell/phone within reach;in bed Nurse Communication: Mobility status PT Visit Diagnosis: Pain;Muscle weakness (generalized) (M62.81);Unsteadiness on feet (R26.81);Difficulty in walking, not elsewhere classified (R26.2) Pain - part of body: (abdomen)     Time: 8099-8338 PT Time Calculation (min) (ACUTE ONLY): 45 min  Charges:  $Gait Training: 23-37 mins $Therapeutic Activity: 8-22 mins                     Ellamae Sia, PT, DPT Acute Rehabilitation Services Pager [phone] Office [phone]    Willy Eddy 11/11/2018, 4:05 PM

## 2018-11-11 NOTE — Progress Notes (Signed)
CT scan results noted - moderate stool volume in ascending colon. Bowel regimen and hopefully contrast given prior to CT will help patient to have a BM. Mobilize as tolerated.  Brigid Re , [facility] 11/11/2018, 2:05 PM Pager: [phone]

## 2018-11-11 NOTE — Consult Note (Signed)
Reason for Consult: Abdominal distention Referring Physician: Ellene Route MD  Tony Christensen is an 58 y.o. male.  HPI: Asked to see patient at the request of Dr. Ellene Route due to abdominal distention.  He is 5 days after lumbar fusion with a retroperitoneal exposure.  He is developed more distention of the last 48 hours that has not responded to ambulation, reduction in pain medication or catharsis.  He denies significant pain but feels quite bloated and distended.  He has had a small amount of flatus last night but no large bowel movement and is getting more distended.  His vital signs are stable and he is not vomiting.  Past Medical History:  Diagnosis Date  . Arthritis   . Cancer ([place])    Bladder/lung  . Diabetes mellitus without complication ([place])   . GERD (gastroesophageal reflux disease)   . Heart murmur   . History of kidney stones   . Hypertension   . Hypothyroidism   . Kidney cysts   . Kidney stones   . Sleep apnea   . Thyroid disease     Past Surgical History:  Procedure Laterality Date  . ABDOMINAL EXPOSURE N/A 11/07/2018   Procedure: ABDOMINAL EXPOSURE;  Surgeon: Rosetta Posner, MD;  Location: [facility] OR;  Service: Vascular;  Laterality: N/A;  . ANTERIOR LATERAL LUMBAR FUSION WITH PERCUTANEOUS SCREW 3 LEVEL N/A 11/07/2018   Procedure: ANTERIOR LATERAL LUMBAR FUSION LUMBAR THREE-FOUR;  Surgeon: Kristeen Miss, MD;  Location: [place];  Service: Neurosurgery;  Laterality: N/A;  Lumbar 3-4 Anterolateral lumbar interbody fusion with Lumbar 3 to Sacral 1 Posterior fixation with pedicle screws  . ANTERIOR LUMBAR FUSION N/A 11/07/2018   Procedure: LUMBAR FOUR-FIVE, LUMBAR FIVE-SACRAL ONE ANTERIOR LUMBAR INTERBODY FUSION;  Surgeon: Kristeen Miss, MD;  Location: [place];  Service: Neurosurgery;  Laterality: N/A;  Lumbar 4-5 Lumbar 5 Sacral 1 Anterior lumbar interbody fusion  . APPLICATION OF ROBOTIC ASSISTANCE FOR SPINAL PROCEDURE N/A 11/07/2018   Procedure: PLACEMENT OF PERCUTANEOUS PEDICLE SCREWS  LUMBAR THREE-SACRAL ONE WITH APPLICATION OF ROBOTIC ASSISTANCE;  Surgeon: Kristeen Miss, MD;  Location: [place];  Service: Neurosurgery;  Laterality: N/A;  . BACK SURGERY    . BLADDER TUMOR EXCISION    . CARPAL TUNNEL WITH CUBITAL TUNNEL Bilateral   . KNEE ARTHROSCOPY W/ MENISCAL REPAIR Right   . NECK SURGERY      Family History  Problem Relation Age of Onset  . Fibromyalgia Mother   . Arthritis Mother   . Brain cancer Father   . Lung cancer Father   . Hypertension Father   . Heart disease Father     Social History:  reports that he has quit smoking. He has never used smokeless tobacco. He reports current alcohol use. He reports that he does not use drugs.  Allergies:  Allergies  Allergen Reactions  . Ceclor [Cefaclor] Anaphylaxis    Medications: I have reviewed the patient's current medications.  Results for orders placed or performed during the hospital encounter of 11/07/18 (from the past 48 hour(s))  Glucose, capillary     Status: Abnormal   Collection Time: 11/09/18 11:26 AM  Result Value Ref Range   Glucose-Capillary 200 (H) 70 - 99 mg/dL  Glucose, capillary     Status: Abnormal   Collection Time: 11/09/18 12:12 PM  Result Value Ref Range   Glucose-Capillary 183 (H) 70 - 99 mg/dL  Glucose, capillary     Status: Abnormal   Collection Time: 11/09/18  4:02 PM  Result Value Ref  Range   Glucose-Capillary 168 (H) 70 - 99 mg/dL  Glucose, capillary     Status: Abnormal   Collection Time: 11/09/18  8:07 PM  Result Value Ref Range   Glucose-Capillary 229 (H) 70 - 99 mg/dL  Glucose, capillary     Status: Abnormal   Collection Time: 11/10/18 12:09 AM  Result Value Ref Range   Glucose-Capillary 168 (H) 70 - 99 mg/dL  Glucose, capillary     Status: Abnormal   Collection Time: 11/10/18  3:56 AM  Result Value Ref Range   Glucose-Capillary 163 (H) 70 - 99 mg/dL  Glucose, capillary     Status: Abnormal   Collection Time: 11/10/18  8:11 AM  Result Value Ref Range    Glucose-Capillary 217 (H) 70 - 99 mg/dL  Urinalysis, Routine w reflex microscopic     Status: Abnormal   Collection Time: 11/10/18 10:27 AM  Result Value Ref Range   Color, Urine YELLOW YELLOW   APPearance CLEAR CLEAR   Specific Gravity, Urine 1.014 1.005 - 1.030   pH 6.0 5.0 - 8.0   Glucose, UA 150 (A) NEGATIVE mg/dL   Hgb urine dipstick SMALL (A) NEGATIVE   Bilirubin Urine NEGATIVE NEGATIVE   Ketones, ur 20 (A) NEGATIVE mg/dL   Protein, ur 30 (A) NEGATIVE mg/dL   Nitrite NEGATIVE NEGATIVE   Leukocytes,Ua NEGATIVE NEGATIVE   RBC / HPF 0-5 0 - 5 RBC/hpf   WBC, UA 6-10 0 - 5 WBC/hpf   Bacteria, UA NONE SEEN NONE SEEN    Comment: Performed at [facility], [place] [address] [place], [place] [address]  Culture, Urine     Status: None   Collection Time: 11/10/18 10:28 AM   Specimen: Urine, Clean Catch  Result Value Ref Range   Specimen Description URINE, CLEAN CATCH    Special Requests NONE    Culture      NO GROWTH Performed at [facility], [place] [address], [place], [place] [address]    Report Status 11/11/2018 FINAL   Glucose, capillary     Status: Abnormal   Collection Time: 11/10/18 11:37 AM  Result Value Ref Range   Glucose-Capillary 192 (H) 70 - 99 mg/dL  Glucose, capillary     Status: Abnormal   Collection Time: 11/10/18  4:07 PM  Result Value Ref Range   Glucose-Capillary 224 (H) 70 - 99 mg/dL  Glucose, capillary     Status: Abnormal   Collection Time: 11/10/18  9:30 PM  Result Value Ref Range   Glucose-Capillary 217 (H) 70 - 99 mg/dL  Glucose, capillary     Status: Abnormal   Collection Time: 11/10/18 11:21 PM  Result Value Ref Range   Glucose-Capillary 169 (H) 70 - 99 mg/dL  Glucose, capillary     Status: Abnormal   Collection Time: 11/11/18  3:41 AM  Result Value Ref Range   Glucose-Capillary 174 (H) 70 - 99 mg/dL  Glucose, capillary     Status: Abnormal   Collection Time: 11/11/18  7:47 AM  Result Value Ref Range   Glucose-Capillary 168 (H) 70 -  99 mg/dL    No results found.  Review of Systems  All other systems reviewed and are negative.  Blood pressure (!) 144/90, pulse 94, temperature 98.2 F (36.8 C), temperature source Oral, resp. rate 16, height 5\' 8"  (1.727 m), weight 96.6 kg, SpO2 98 %. Physical Exam  Constitutional: He appears well-developed.  HENT:  Head: Normocephalic.  Eyes: Pupils are equal, round, and  reactive to light.  Neck: Normal range of motion.  Cardiovascular: Normal rate.  Respiratory: Effort normal.  GI: He exhibits distension. There is abdominal tenderness.    Musculoskeletal: Normal range of motion.  Neurological: He is alert.    Assessment/Plan: Abdominal distention  Recommend CT scan abdomen pelvis with p.o. and IV contrast to further evaluate.  Will gently hydrate him.  NG tube if he vomits.  Continue ambulation and reduction of pain medication.  Care discussed with Dr. Ellene Route at bedside  [place] 11/11/2018, 8:29 AM

## 2018-11-11 NOTE — Progress Notes (Signed)
Patient ID: Tony Christensen, male   DOB: [birth year], 58 y.o.   MRN: [id number] Vital signs are stable however patient's abdomen is more markedly distended this morning.  I have asked Dr. Brantley Stage to see the patient from general surgery because of this most significant ileus.  I appreciate his evaluation.  I will be out of town for the next weeks time and I have asked Dr. Deatra Ina to follow-up with the patient while in the hospital.  I am hopeful for quick resolution of his ileus so that he may be discharged home.

## 2018-11-12 LAB — GLUCOSE, CAPILLARY
Glucose-Capillary: 156 mg/dL — ABNORMAL HIGH (ref 70–99)
Glucose-Capillary: 156 mg/dL — ABNORMAL HIGH (ref 70–99)
Glucose-Capillary: 209 mg/dL — ABNORMAL HIGH (ref 70–99)

## 2018-11-12 MED ORDER — OXYCODONE-ACETAMINOPHEN 5-325 MG PO TABS: 1.0000 | ORAL_TABLET | ORAL | 0 refills | Status: DC | PRN

## 2018-11-12 MED ORDER — DOCUSATE SODIUM 100 MG PO CAPS: 100.0000 mg | ORAL_CAPSULE | Freq: Two times a day (BID) | ORAL | 0 refills | Status: DC

## 2018-11-12 MED ORDER — MAGNESIUM CHLORIDE 64 MG PO TBEC: 1.0000 | DELAYED_RELEASE_TABLET | Freq: Every day | ORAL | 0 refills | Status: AC

## 2018-11-12 NOTE — Progress Notes (Signed)
Neurosurgery Service Progress Note  Subjective: No acute events overnight, pt had multiple BMs tonight, abdominal distention improving   Objective: Vitals:   11/11/18 2000 11/11/18 2350 11/12/18 0823 11/12/18 1100  BP: (!) 167/87 (!) 141/84 (!) 156/86 (!) 158/88  Pulse: (!) 108 98 92 80  Resp: 16 16  17   Temp: 97.8 F (36.6 C) 98.3 F (36.8 C) 98.3 F (36.8 C) 98.2 F (36.8 C)  TempSrc: Oral Oral Oral Oral  SpO2: 97% 96% 99% 100%  Weight:      Height:       Temp (24hrs), Avg:98.1 F (36.7 C), Min:97.7 F (36.5 C), Max:98.4 F (36.9 C)  CBC Latest Ref Rng & Units 11/08/2018 11/07/2018 11/03/2018  WBC 4.0 - 10.5 K/uL 9.2 - 7.4  Hemoglobin 13.0 - 17.0 g/dL 11.7(L) 13.9 16.0  Hematocrit 39.0 - 52.0 % 33.9(L) 41.0 46.5  Platelets 150 - 400 K/uL 206 - 285   BMP Latest Ref Rng & Units 11/08/2018 11/07/2018 11/03/2018  Glucose 70 - 99 mg/dL 140(H) 268(H) 195(H)  BUN 6 - 20 mg/dL 18 - 10  Creatinine 0.61 - 1.24 mg/dL 1.22 - 1.00  Sodium 135 - 145 mmol/L 135 135 136  Potassium 3.5 - 5.1 mmol/L 3.9 5.3(H) 3.8  Chloride 98 - 111 mmol/L 103 - 101  CO2 22 - 32 mmol/L 23 - 26  Calcium 8.9 - 10.3 mg/dL 7.9(L) - 9.2    Intake/Output Summary (Last 24 hours) at 11/12/2018 1201 Last data filed at 11/11/2018 2205 Gross per 24 hour  Intake 3 ml  Output -  Net 3 ml    Current Facility-Administered Medications:  .  acetaminophen (TYLENOL) tablet 650 mg, 650 mg, Oral, Q4H PRN **OR** acetaminophen (TYLENOL) suppository 650 mg, 650 mg, Rectal, Q4H PRN, Kristeen Miss, MD .  alum & mag hydroxide-simeth (MAALOX/MYLANTA) 200-200-20 MG/5ML suspension 30 mL, 30 mL, Oral, Q6H PRN, Kristeen Miss, MD .  atorvastatin (LIPITOR) tablet 20 mg, 20 mg, Oral, Daily, Kristeen Miss, MD, 20 mg at 11/12/18 1036 .  azelastine (ASTELIN) 0.1 % nasal spray 2 spray, 2 spray, Each Nare, BID PRN, Kristeen Miss, MD .  benazepril (LOTENSIN) tablet 10 mg, 10 mg, Oral, Daily, Kristeen Miss, MD, 10 mg at 11/12/18 1036 .   bisacodyl (DULCOLAX) suppository 10 mg, 10 mg, Rectal, Daily PRN, Kristeen Miss, MD, 10 mg at 11/09/18 1436 .  cholecalciferol (VITAMIN D) tablet 5,000 Units, 5,000 Units, Oral, Daily, Kristeen Miss, MD, 5,000 Units at 11/12/18 1035 .  cholecalciferol (VITAMIN D3) tablet 5,000 Units, 5,000 Units, Oral, Daily, Kristeen Miss, MD, 5,000 Units at 11/08/18 [phone] .  ciprofloxacin (CIPRO) tablet 250 mg, 250 mg, Oral, BID, Kristeen Miss, MD, 250 mg at 11/12/18 0900 .  cyclobenzaprine (FLEXERIL) tablet 10 mg, 10 mg, Oral, QHS, Kristeen Miss, MD, 10 mg at 11/11/18 2204 .  diphenhydrAMINE (BENADRYL) capsule 25-50 mg, 25-50 mg, Oral, Daily PRN, Kristeen Miss, MD .  docusate sodium (COLACE) capsule 100 mg, 100 mg, Oral, BID, Kristeen Miss, MD, 100 mg at 11/12/18 1035 .  fluticasone (FLONASE) 50 MCG/ACT nasal spray 1 spray, 1 spray, Each Nare, Daily PRN, Kristeen Miss, MD .  insulin aspart (novoLOG) injection 0-20 Units, 0-20 Units, Subcutaneous, Q4H, Corinda Gubler, RPH, 4 Units at 11/12/18 0900 .  lactated ringers infusion, , Intravenous, Continuous, Kristeen Miss, MD, Stopped at 11/08/18 1132 .  levothyroxine (SYNTHROID) tablet 75 mcg, 75 mcg, Oral, QAC breakfast, Kristeen Miss, MD, 75 mcg at 11/12/18 0537 .  magnesium citrate solution 1 Bottle,  1 Bottle, Oral, Daily PRN, Rayburn, Kelly A, PA-C, 1 Bottle at 11/11/18 1445 .  magnesium hydroxide (MILK OF MAGNESIA) suspension 30 mL, 30 mL, Oral, Daily, Kristeen Miss, MD, 30 mL at 11/12/18 1037 .  menthol-cetylpyridinium (CEPACOL) lozenge 3 mg, 1 lozenge, Oral, PRN **OR** phenol (CHLORASEPTIC) mouth spray 1 spray, 1 spray, Mouth/Throat, PRN, Kristeen Miss, MD .  metFORMIN (GLUCOPHAGE) tablet 500 mg, 500 mg, Oral, BID WC, Kristeen Miss, MD, Stopped at 11/11/18 0910 .  methocarbamol (ROBAXIN) tablet 500 mg, 500 mg, Oral, Q6H PRN, 500 mg at 11/10/18 2136 **OR** methocarbamol (ROBAXIN) 500 mg in dextrose 5 % 50 mL IVPB, 500 mg, Intravenous, Q6H PRN, Kristeen Miss,  MD .  metoprolol succinate (TOPROL-XL) 24 hr tablet 50 mg, 50 mg, Oral, QPM, Kristeen Miss, MD, 50 mg at 11/11/18 1956 .  morphine 2 MG/ML injection 2-4 mg, 2-4 mg, Intravenous, Q2H PRN, Kristeen Miss, MD, 2 mg at 11/07/18 1922 .  multivitamin with minerals tablet 1 tablet, 1 tablet, Oral, Daily, Kristeen Miss, MD, 1 tablet at 11/12/18 1035 .  nortriptyline (PAMELOR) capsule 10 mg, 10 mg, Oral, QHS, Kristeen Miss, MD, 10 mg at 11/11/18 2204 .  ondansetron (ZOFRAN) tablet 4 mg, 4 mg, Oral, Q6H PRN **OR** ondansetron (ZOFRAN) injection 4 mg, 4 mg, Intravenous, Q6H PRN, Kristeen Miss, MD .  oxyCODONE-acetaminophen (PERCOCET/ROXICET) 5-325 MG per tablet 1-2 tablet, 1-2 tablet, Oral, Q3H PRN, Kristeen Miss, MD, 2 tablet at 11/10/18 2136 .  pantoprazole (PROTONIX) EC tablet 40 mg, 40 mg, Oral, Daily, Kristeen Miss, MD, 40 mg at 11/12/18 1035 .  polyethylene glycol (MIRALAX / GLYCOLAX) packet 17 g, 17 g, Oral, BID, Rayburn, Kelly A, PA-C, 17 g at 11/12/18 1037 .  pregabalin (LYRICA) capsule 100 mg, 100 mg, Oral, BID, Kristeen Miss, MD, 100 mg at 11/12/18 1035 .  senna (SENOKOT) tablet 8.6 mg, 1 tablet, Oral, BID, Kristeen Miss, MD, 8.6 mg at 11/12/18 1035 .  sodium chloride flush (NS) 0.9 % injection 3 mL, 3 mL, Intravenous, Q12H, Kristeen Miss, MD, 3 mL at 11/12/18 1038 .  sodium chloride flush (NS) 0.9 % injection 3 mL, 3 mL, Intravenous, PRN, Kristeen Miss, MD, 3 mL at 11/07/18 2124 .  sodium phosphate (FLEET) 7-19 GM/118ML enema 1 enema, 1 enema, Rectal, Daily PRN, Kristeen Miss, MD, 1 enema at 11/10/18 1645 .  tamsulosin (FLOMAX) capsule 0.4 mg, 0.4 mg, Oral, QPM, Kristeen Miss, MD, 0.4 mg at 11/11/18 1956   Physical Exam: Strength 5/5x4 except for expected hip flexor weakness, SILTx4  Assessment & Plan: 58 y.o. man s/p DLIF, ALIF, posterior instrumentation, post-op ileus, now resolved.  -discharge home today  Judith Part  11/12/18 12:01 PM

## 2018-11-12 NOTE — Progress Notes (Signed)
Occupational Therapy Treatment Patient Details Name: Tony Christensen MRN: [id number] DOB: Jul 25, 1960 Today's Date: 11/12/2018    History of present illness Patient is a 58 y/o male who presents with 3 stage back surgery- L3-4, 4-5, L5-S1 fusion, anterior exposure. PMH includes multiple back surgeries, HTN, DM, ca, sleep apnea.   OT comments  Pt. Able to complete bed mobility, UB/LB dressing, and simulated toileting and shower stall transfers min guard A.  Note d/c home today or tomorrow.    Follow Up Recommendations  Home health OT    Equipment Recommendations  None recommended by OT    Recommendations for Other Services      Precautions / Restrictions Precautions Precautions: Back Precaution Comments: pt able to recall all precautions and adhere functionally Required Braces or Orthoses: Spinal Brace Spinal Brace: Applied in sitting position       Mobility Bed Mobility Overal bed mobility: Needs Assistance Bed Mobility: Sidelying to Sit;Rolling;Sit to Sidelying Rolling: Min guard Sidelying to sit: Min guard   Sit to supine: Min guard Sit to sidelying: Min guard General bed mobility comments: No physical assist, increased time and effort wtih cues for log roll tech.  Transfers                      Balance                                           ADL either performed or assessed with clinical judgement   ADL Overall ADL's : Needs assistance/impaired             Lower Body Bathing: Sitting/lateral leans Lower Body Bathing Details (indicate cue type and reason): able to turn and bring each leg up/each side to reach RLEs Upper Body Dressing : Set up;Standing;Sitting Upper Body Dressing Details (indicate cue type and reason): able to don brace without physical assistance   Lower Body Dressing Details (indicate cue type and reason): able to turn and bring each leg up/each side to reach RLEs Toilet Transfer: Supervision/safety;RW        Tub/ Shower Transfer: Walk-in shower;Ambulation;Rolling walker;Anterior/posterior Tub/Shower Transfer Details (indicate cue type and reason): able to return demo of safe shower stall transfer with small ledge Functional mobility during ADLs: Min guard;Rolling walker       Vision       Perception     Praxis      Cognition Arousal/Alertness: Awake/alert Behavior During Therapy: WFL for tasks assessed/performed Overall Cognitive Status: Within Functional Limits for tasks assessed                                          Exercises     Shoulder Instructions       General Comments      Pertinent Vitals/ Pain       Pain Assessment: 0-10 Pain Score: 6  Pain Location: back legs Pain Descriptors / Indicators: Grimacing;Guarding Pain Intervention(s): Limited activity within patient's tolerance;Monitored during session;Repositioned  Home Living                                          Prior Functioning/Environment  Frequency  Min 3X/week        Progress Toward Goals  OT Goals(current goals can now be found in the care plan section)     ADL Goals Pt Will Perform Upper Body Bathing: with set-up;with supervision;sitting Pt Will Perform Lower Body Bathing: with set-up;with supervision;with adaptive equipment;sit to/from stand Pt Will Perform Upper Body Dressing: with set-up;with supervision;sitting Pt Will Perform Lower Body Dressing: with set-up;with supervision;with adaptive equipment;sit to/from stand Pt Will Perform Tub/Shower Transfer: Shower transfer;with supervision;ambulating;rolling walker;3 in 1 Additional ADL Goal #1: Pt will be Mod I in and OOB for basic ADLs  Plan Discharge plan remains appropriate    Co-evaluation                 AM-PAC OT "6 Clicks" Daily Activity     Outcome Measure   Help from another person eating meals?: None Help from another person taking care of personal grooming?:  None Help from another person toileting, which includes using toliet, bedpan, or urinal?: A Little Help from another person bathing (including washing, rinsing, drying)?: A Lot Help from another person to put on and taking off regular upper body clothing?: A Little Help from another person to put on and taking off regular lower body clothing?: Total 6 Click Score: 17    End of Session Equipment Utilized During Treatment: Rolling walker;Back brace  OT Visit Diagnosis: Unsteadiness on feet (R26.81);Muscle weakness (generalized) (M62.81)   Activity Tolerance Patient tolerated treatment well   Patient Left in bed;with call bell/phone within reach   Nurse Communication          Time: 8921-1941 OT Time Calculation (min): 8 min  Charges: OT General Charges $OT Visit: 1 Visit OT Treatments $Self Care/Home Management : 8-22 mins   Janice Coffin, COTA/L 11/12/2018, 1:00 PM

## 2018-11-12 NOTE — Discharge Summary (Signed)
Discharge Summary  Date of Admission: 11/07/2018  Date of Discharge: 11/12/18  Attending Physician: Kristeen Miss, MD  Hospital Course: Patient was admitted following an uncomplicated A3-G7 ALIF, anterolateral lumbar fusion, and posterior pedicle screw placement. He was recovered in PACU and transferred to 4NP. His hospital course was complicated by post-op ileus, which resolved on 11/12/2018 without intervention. His course was otherwise uncomplicated and the patient was discharged home on 11/12/2018. He will follow up in clinic with Dr. Ellene Route in 2 weeks.  Neurologic exam at discharge:  AOx3, PERRL, EOMI, FS, TM Strength 5/5 x4 except for HF weakness, SILTx4  Discharge diagnosis: Lumbar spondylosis  Judith Part, MD 11/12/18 12:08 PM

## 2018-11-12 NOTE — Progress Notes (Signed)
Physical Therapy Treatment Patient Details Name: Tony Christensen MRN: [id number] DOB: [birth year] Today's Date: 11/12/2018    History of Present Illness Patient is a 58 y/o male who presents with 3 stage back surgery- L3-4, 4-5, L5-S1 fusion, anterior exposure. PMH includes multiple back surgeries, HTN, DM, ca, sleep apnea.    PT Comments    Pt is able to ambulate 250' with RW without assistance.  He reports and demonstrated good awareness of back precautions and he has no stairs to enter his home.  He feels ready and is functionally ready for d/c today if physician agrees.  PT to follow acutely until d/c confirmed.     Follow Up Recommendations  No PT follow up;Supervision - Intermittent     Equipment Recommendations  Rolling walker with 5" wheels    Recommendations for Other Services   NA     Precautions / Restrictions Precautions Precautions: Back Precaution Booklet Issued: Yes (comment) Precaution Comments: pt able to recall all precautions and adhere functionally Required Braces or Orthoses: Spinal Brace Spinal Brace: Applied in sitting position    Mobility  Bed Mobility Overal bed mobility: Modified Independent                Transfers Overall transfer level: Modified independent Equipment used: Rolling walker (2 wheeled)                Ambulation/Gait Ambulation/Gait assistance: Supervision Gait Distance (Feet): 250 Feet Assistive device: Rolling walker (2 wheeled) Gait Pattern/deviations: Step-through pattern;Trunk flexed             Balance Overall balance assessment: Needs assistance Sitting-balance support: Feet supported;No upper extremity supported Sitting balance-Leahy Scale: Good     Standing balance support: No upper extremity supported Standing balance-Leahy Scale: Good                              Cognition Arousal/Alertness: Awake/alert Behavior During Therapy: WFL for tasks assessed/performed Overall Cognitive  Status: Within Functional Limits for tasks assessed                                               Pertinent Vitals/Pain Pain Assessment: 0-10 Pain Score: 5  Pain Location: back legs Pain Descriptors / Indicators: Grimacing;Guarding Pain Intervention(s): Limited activity within patient's tolerance;Monitored during session;Repositioned           PT Goals (current goals can now be found in the care plan section) Acute Rehab PT Goals Patient Stated Goal: to 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  Progress towards PT goals: Progressing toward goals    Frequency    Min 5X/week      PT Plan Current plan remains appropriate       AM-PAC PT "6 Clicks" Mobility   Outcome Measure  Help needed turning from your back to your side while in a flat bed without using bedrails?: A Little Help needed moving from lying on your back to sitting on the side of a flat bed without using bedrails?: None Help needed moving to and from a bed to a chair (including a wheelchair)?: None Help needed standing up from a chair using your arms (e.g., wheelchair or bedside chair)?: None Help needed to walk in hospital room?: None Help needed climbing 3-5 steps with a railing? :  A Little 6 Click Score: 22    End of Session Equipment Utilized During Treatment: Back brace Activity Tolerance: Patient limited by pain Patient left: in bed(seated EOB)   PT Visit Diagnosis: Pain;Muscle weakness (generalized) (M62.81);Unsteadiness on feet (R26.81);Difficulty in walking, not elsewhere classified (R26.2) Pain - Right/Left: (bil) Pain - part of body: Leg(bil legs)     Time: 3016-0109 PT Time Calculation (min) (ACUTE ONLY): 15 min  Charges:  $Gait Training: 8-22 mins                    [name], PT, DPT  Acute Rehabilitation [phone] pager [phone] office  @ Tony Christensen: [phone]   11/12/2018, 8:09 AM

## 2018-11-12 NOTE — Progress Notes (Addendum)
[facility] Progress Note  5 Days Post-Op  Subjective: CC-  Comfortable this morning. CT scan yesterday showed constipation, no obstruction. States that he has had 6-8 BMs since about 1900 last night. Abdominal pain improving. Denies n/v. Tolerated breakfast.  Objective: Vital signs in last 24 hours: Temp:  [97.7 F (36.5 C)-98.4 F (36.9 C)] 98.3 F (36.8 C) (06/27 0823) Pulse Rate:  [92-116] 92 (06/27 0823) Resp:  [16] 16 (06/26 2350) BP: (141-167)/(84-94) 156/86 (06/27 0823) SpO2:  [96 %-99 %] 99 % (06/27 0823) Last BM Date: 11/11/18  Intake/Output from previous day: 06/26 0701 - 06/27 0700 In: 243 [P.O.:240; I.V.:3] Out: -  Intake/Output this shift: No intake/output data recorded.  PE: Gen:  Alert, NAD, pleasant HEENT: EOM's intact, pupils equal and round Pulm:  effort normal Abd: Soft, distended, mild lower abdominal TTP without peritonitis, +BS Psych: A&Ox3  Skin: warm and dry  Lab Results:  No results for input(s): WBC, HGB, HCT, PLT in the last 72 hours. BMET No results for input(s): NA, K, CL, CO2, GLUCOSE, BUN, CREATININE, CALCIUM in the last 72 hours. PT/INR No results for input(s): LABPROT, INR in the last 72 hours. CMP     Component Value Date/Time   NA 135 11/08/2018 0634   K 3.9 11/08/2018 0634   CL 103 11/08/2018 0634   CO2 23 11/08/2018 0634   GLUCOSE 140 (H) 11/08/2018 0634   BUN 18 11/08/2018 0634   CREATININE 1.22 11/08/2018 0634   CALCIUM 7.9 (L) 11/08/2018 0634   GFRNONAA >60 11/08/2018 0634   GFRAA >60 11/08/2018 0634   Lipase  No results found for: LIPASE     Studies/Results: Ct Abdomen Pelvis W Contrast  Result Date: 11/11/2018 CLINICAL DATA:  Recent back surgery, abdominal pain EXAM: CT ABDOMEN AND PELVIS WITH CONTRAST TECHNIQUE: Multidetector CT imaging of the abdomen and pelvis was performed using the standard protocol following bolus administration of intravenous contrast. CONTRAST:  138mL ISOVUE-300 IOPAMIDOL  (ISOVUE-300) INJECTION 61% COMPARISON:  Lumbar CT 18 20 FINDINGS: Lower chest:  Lung bases are clear. Hepatobiliary: No focal hepatic lesion.  Normal gallbladder. Pancreas: Normal pancreatic parenchymal intensity. No ductal dilatation or inflammation. Spleen: Normal spleen. Adrenals/urinary tract: Adrenal glands are normal. No renal obstruction. Bilateral renal calculi are present. Ureters normal. Bladder normal. Delayed imaging demonstrates no ureteral obstruction. Stomach/Bowel: Stomach, duodenum and small-bowel normal. Appendix normal. Moderate volume stool in the ascending colon. Transverse and descending colon normal. Rectosigmoid colon normal Vascular/Lymphatic: Abdominal aorta is normal caliber with atherosclerotic calcification. There is no retroperitoneal or periportal lymphadenopathy. No pelvic lymphadenopathy. Musculoskeletal: Posterior lumbar fusion. There is mild edema surrounding the LEFT psoas muscle and small amount of retroperitoneal fluid (image 159/3. No organized collection. No organized hematoma. IMPRESSION: 1. Postsurgical change consistent with lumbar fusion. Edema of the LEFT psoas muscles with retroperitoneal fluid along the LEFT iliac vessels. No organized collections or hematoma. 2. Moderate volume stool in the ascending colon could indicate constipation. 3. Bilateral nephrolithiasis without evidence of obstruction. Electronically Signed   By: Suzy Bouchard M.D.   On: 11/11/2018 12:17    Anti-infectives: Anti-infectives (From admission, onward)   Start     Dose/Rate Route Frequency Ordered Stop   11/10/18 1100  ciprofloxacin (CIPRO) tablet 250 mg     250 mg Oral 2 times daily 11/10/18 1027     11/07/18 0902  bacitracin 50,000 Units in sodium chloride 0.9 % 500 mL irrigation  Status:  Discontinued       As needed 11/07/18  1829 11/07/18 1620   11/07/18 0600  vancomycin (VANCOCIN) 1,500 mg in sodium chloride 0.9 % 500 mL IVPB     1,500 mg 250 mL/hr over 120 Minutes Intravenous  To ShortStay Surgical 11/04/18 0939 11/07/18 0800       Assessment/Plan  S/p lumbar fusion with a retroperitoneal exposure 6/22 Dr. Ellene Route Abdominal distension Constipation - CT scan showed constipation, no obstruction - Patient now having bowel function, abdominal pain/distension improving, and he is tolerating a diet. Continue bowel regimen.  General surgery will sign off, please call with concerns.   LOS: 5 days    Wellington Hampshire , [facility] 11/12/2018, 9:46 AM Pager: [phone] Mon-Thurs 7:00 am-4:30 pm Fri 7:00 am -11:30 AM Sat-Sun 7:00 am-11:30 am

## 2018-12-22 ENCOUNTER — Other Ambulatory Visit (HOSPITAL_COMMUNITY): Payer: Self-pay | Admitting: Neurological Surgery

## 2018-12-22 ENCOUNTER — Other Ambulatory Visit: Payer: Self-pay | Admitting: Neurological Surgery

## 2018-12-22 DIAGNOSIS — M5416 Radiculopathy, lumbar region: Secondary | ICD-10-CM

## 2018-12-22 MED ORDER — SODIUM CHLORIDE 0.9 % IV SOLN: 4.0000 mg | Freq: Four times a day (QID) | INTRAVENOUS | Status: DC | PRN

## 2018-12-28 ENCOUNTER — Ambulatory Visit (HOSPITAL_COMMUNITY)
Admission: RE | Admit: 2018-12-28 | Discharge: 2018-12-28 | Disposition: A | Discharge status: Home / Self Care | Payer: Medicare PPO | Source: Ambulatory Visit | Attending: Neurological Surgery | Admitting: Neurological Surgery

## 2018-12-28 ENCOUNTER — Other Ambulatory Visit: Payer: Self-pay

## 2018-12-28 DIAGNOSIS — E119 Type 2 diabetes mellitus without complications: Secondary | ICD-10-CM | POA: Diagnosis not present

## 2018-12-28 DIAGNOSIS — M5416 Radiculopathy, lumbar region: Secondary | ICD-10-CM

## 2018-12-28 LAB — GLUCOSE, CAPILLARY: Glucose-Capillary: 162 mg/dL — ABNORMAL HIGH (ref 70–99)

## 2018-12-28 MED ORDER — ONDANSETRON HCL 4 MG/2ML IJ SOLN: 4.0000 mg | Freq: Four times a day (QID) | INTRAMUSCULAR | Status: DC | PRN

## 2018-12-28 MED ORDER — INSULIN ASPART 100 UNIT/ML ~~LOC~~ SOLN: 0.0000 [IU] | SUBCUTANEOUS | Status: DC

## 2018-12-28 MED ORDER — HYDROCODONE-ACETAMINOPHEN 5-325 MG PO TABS
1.0000 | ORAL_TABLET | ORAL | Status: DC | PRN
  Administered 2018-12-28 (×2): 1 via ORAL
  Filled 2018-12-28: qty 1

## 2018-12-28 MED ORDER — HYDROCODONE-ACETAMINOPHEN 5-325 MG PO TABS
ORAL_TABLET | ORAL | Status: AC
  Administered 2018-12-28: 11:00:00 1 via ORAL
  Filled 2018-12-28: qty 1

## 2018-12-28 MED ORDER — DIAZEPAM 5 MG PO TABS
10.0000 mg | ORAL_TABLET | Freq: Once | ORAL | Status: AC
  Administered 2018-12-28: 10 mg via ORAL

## 2018-12-28 MED ORDER — DIAZEPAM 5 MG PO TABS
ORAL_TABLET | ORAL | Status: AC
  Filled 2018-12-28: qty 2

## 2018-12-28 MED ORDER — LIDOCAINE HCL (PF) 1 % IJ SOLN
5.0000 mL | Freq: Once | INTRAMUSCULAR | Status: AC
  Administered 2018-12-28: 09:00:00 5 mL via INTRADERMAL

## 2018-12-28 MED ORDER — IOHEXOL 180 MG/ML  SOLN
20.0000 mL | Freq: Once | INTRAMUSCULAR | Status: AC | PRN
  Administered 2018-12-28: 12 mL via INTRATHECAL

## 2018-12-28 NOTE — Procedures (Signed)
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  Several months ago he underwent an anterior lumbar decompression and arthrodesis followed by an anterolateral decompression at L3-4 he is now fused from L3 to the sacrum initially seemed to be doing well but he subsequently has developed significant back and left lower extremity pain that is severe and unrelenting.  A myelogram is now being performed to better evaluate this process.  Pre op Dx: Lumbar radiculopathy left greater than right: Recent decompression and fusion surgery from L3 to the sacrum Post op Dx: Same Procedure: Lumbar myelogram Surgeon: Louretta Tantillo Puncture level: L2-3 Fluid color: Clear colorless Injection: Iohexol 200, 12 mL Findings: Mild residual stenosis L5-S1 mixed injection further evaluation with CT scanning

## 2018-12-28 NOTE — Discharge Instructions (Signed)
Myelogram Discharge Instructions  1. Go home and rest quietly for the next 24 hours.  It is important to lie flat for the next 24 hours.  Get up only to go to the restroom.  You may lie in the bed or on a couch on your back, your stomach, your left side or your right side.  You may have one pillow under your head.  You may have pillows between your knees while you are on your side or under your knees while you are on your back.  2. DO NOT drive today.  Recline the seat as far back as it will go, while still wearing your seat belt, on the way home.  3. You may get up to go to the bathroom as needed.  You may sit up for 10 minutes to eat.  You may resume your normal diet and medications unless otherwise indicated.  4. The incidence of headache, nausea, or vomiting is about 5% (one in 20 patients).  If you develop a headache, lie flat and drink plenty of fluids until the headache goes away.  Caffeinated beverages may be helpful.  If you develop severe nausea and vomiting or a headache that does not go away with flat bed rest, call the Dr. Clarice Pole office.  5. You may resume normal activities after your 24 hours of bed rest is over; however, do not exert yourself strongly or do any heavy lifting tomorrow.  6. Call your physician for a follow-up appointment.  The results of your myelogram will be sent directly to your physician by the following day.  7. If you have any questions or if complications develop after you arrive home, please call Dr. Clarice Pole office.  Discharge instructions have been explained to the patient.  The patient, or the person responsible for the patient, fully understands these instructions.

## 2018-12-30 MED FILL — Diazepam Tab 5 MG: ORAL | Qty: 1 | Status: AC

## 2019-07-05 ENCOUNTER — Other Ambulatory Visit (HOSPITAL_COMMUNITY): Payer: Self-pay | Admitting: Neurological Surgery

## 2019-07-05 ENCOUNTER — Other Ambulatory Visit: Payer: Self-pay | Admitting: Neurological Surgery

## 2019-07-05 DIAGNOSIS — M48062 Spinal stenosis, lumbar region with neurogenic claudication: Secondary | ICD-10-CM

## 2019-07-19 ENCOUNTER — Ambulatory Visit (HOSPITAL_COMMUNITY)
Admission: RE | Admit: 2019-07-19 | Discharge: 2019-07-19 | Disposition: A | Discharge status: Home / Self Care | Payer: Medicare PPO | Source: Ambulatory Visit | Attending: Neurological Surgery | Admitting: Neurological Surgery

## 2019-07-19 ENCOUNTER — Other Ambulatory Visit: Payer: Self-pay

## 2019-07-19 DIAGNOSIS — M48062 Spinal stenosis, lumbar region with neurogenic claudication: Secondary | ICD-10-CM | POA: Diagnosis present

## 2019-08-02 ENCOUNTER — Other Ambulatory Visit: Payer: Self-pay | Admitting: Neurological Surgery

## 2019-09-06 NOTE — Pre-Procedure Instructions (Addendum)
Tony Christensen [facility]  09/06/2019      [facility] - [place], [place] - [address] [place] [address] Phone: [phone] Fax: [phone]    Your procedure is scheduled on April 26  Report to [facility] Entrance A at 5:30 A.M.  Call this number if you have problems the morning of surgery:  [phone]   Remember:  Do not eat or drink after midnight.      Take these medicines the morning of surgery with A SIP OF WATER :               Cyclobenzaprine (flexeril) if needed              Benadryl              Nasal spray if needed              Hydrocodone if needed              Levothyroxine (synthroid)              Metoprolol (lopressor)              Omeprazole (prilosec)              pregabalin (lyrica)              7 days prior to surgery STOP taking any Aspirin (unless otherwise instructed by your surgeon), Aleve, Naproxen, Ibuprofen, Motrin, Advil, Goody's, BC's, all herbal medications, fish oil, and all vitamins.                               How to Manage Your Diabetes Before and After Surgery  Why is it important to control my blood sugar before and after surgery? . Improving blood sugar levels before and after surgery helps healing and can limit problems. . A way of improving blood sugar control is eating a healthy diet by: o  Eating less sugar and carbohydrates o  Increasing activity/exercise o  Talking with your doctor about reaching your blood sugar goals . High blood sugars (greater than 180 mg/dL) can raise your risk of infections and slow your recovery, so you will need to focus on controlling your diabetes during the weeks before surgery. . Make sure that the doctor who takes care of your diabetes knows about your planned surgery including the date and location.  How do I manage my blood sugar before surgery? . Check your blood sugar at least 4 times a day, starting 2 days before surgery, to make sure  that the level is not too high or low. o Check your blood sugar the morning of your surgery when you wake up and every 2 hours until you get to the Short Stay unit. . If your blood sugar is less than 70 mg/dL, you will need to treat for low blood sugar: o Do not take insulin. o Treat a low blood sugar (less than 70 mg/dL) with  cup of clear juice (cranberry or apple), 4 glucose tablets, OR glucose gel. Recheck blood sugar in 15 minutes after treatment (to make sure it is greater than 70 mg/dL). If your blood sugar is not greater than 70 mg/dL on recheck, call [phone] o  for further instructions. . Report your blood sugar to the short stay nurse when you get to Short Stay.  . If you  are admitted to the hospital after surgery: o Your blood sugar will be checked by the staff and you will probably be given insulin after surgery (instead of oral diabetes medicines) to make sure you have good blood sugar levels. o The goal for blood sugar control after surgery is 80-180 mg/dL.       WHAT DO I DO ABOUT MY DIABETES MEDICATION?   Marland Kitchen Do not take oral diabetes medicines (pills) the morning of surgery. (metformin/glucophage)      . If your CBG is greater than 220 mg/dL, you may take  of your sliding scale (correction) dose of insulin.                 Do not wear jewelry.  Do not wear lotions, powders, or perfumes, or deodorant.  Do not shave 48 hours prior to surgery.  Men may shave face and neck.  Do not bring valuables to the hospital.  [facility] is not responsible for any belongings or valuables.  Contacts, dentures or bridgework may not be worn into surgery.  Leave your suitcase in the car.  After surgery it may be brought to your room.  For patients admitted to the hospital, discharge time will be determined by your treatment team.  Patients discharged the day of surgery will not be allowed to drive home.    Special instructions:  [place]- Preparing For Surgery  Before  surgery, you can play an important role. Because skin is not sterile, your skin needs to be as free of germs as possible. You can reduce the number of germs on your skin by washing with CHG (chlorahexidine gluconate) Soap before surgery.  CHG is an antiseptic cleaner which kills germs and bonds with the skin to continue killing germs even after washing.    Oral Hygiene is also important to reduce your risk of infection.  Remember - BRUSH YOUR TEETH THE MORNING OF SURGERY WITH YOUR REGULAR TOOTHPASTE  Please do not use if you have an allergy to CHG or antibacterial soaps. If your skin becomes reddened/irritated stop using the CHG.  Do not shave (including legs and underarms) for at least 48 hours prior to first CHG shower. It is OK to shave your face.  Please follow these instructions carefully.   1. Shower the NIGHT BEFORE SURGERY and the MORNING OF SURGERY with CHG.   2. If you chose to wash your hair, wash your hair first as usual with your normal shampoo.  3. After you shampoo, rinse your hair and body thoroughly to remove the shampoo.  4. Use CHG as you would any other liquid soap. You can apply CHG directly to the skin and wash gently with a scrungie or a clean washcloth.   5. Apply the CHG Soap to your body ONLY FROM THE NECK DOWN.  Do not use on open wounds or open sores. Avoid contact with your eyes, ears, mouth and genitals (private parts). Wash Face and genitals (private parts)  with your normal soap.  6. Wash thoroughly, paying special attention to the area where your surgery will be performed.  7. Thoroughly rinse your body with warm water from the neck down.  8. DO NOT shower/wash with your normal soap after using and rinsing off the CHG Soap.  9. Pat yourself dry with a CLEAN TOWEL.  10. Wear CLEAN PAJAMAS to bed the night before surgery, wear comfortable clothes the morning of surgery  11. Place CLEAN SHEETS on your bed the night of  your first shower and DO NOT SLEEP WITH  PETS.    Day of Surgery:  Do not apply any deodorants/lotions.  Please wear clean clothes to the hospital/surgery center.   Remember to brush your teeth WITH YOUR REGULAR TOOTHPASTE.    Please read over the following fact sheets that you were given. Coughing and Deep Breathing and Surgical Site Infection Prevention

## 2019-09-07 ENCOUNTER — Encounter (HOSPITAL_COMMUNITY)
Admission: RE | Admit: 2019-09-07 | Discharge: 2019-09-07 | Disposition: A | Discharge status: Home / Self Care | Payer: Medicare PPO | Source: Ambulatory Visit | Attending: Neurological Surgery | Admitting: Neurological Surgery

## 2019-09-07 ENCOUNTER — Other Ambulatory Visit (HOSPITAL_COMMUNITY)
Admission: RE | Admit: 2019-09-07 | Discharge: 2019-09-07 | Disposition: A | Discharge status: Home / Self Care | Payer: Medicare PPO | Source: Ambulatory Visit | Attending: Neurological Surgery | Admitting: Neurological Surgery

## 2019-09-07 ENCOUNTER — Other Ambulatory Visit: Payer: Self-pay

## 2019-09-07 ENCOUNTER — Encounter (HOSPITAL_COMMUNITY): Payer: Self-pay

## 2019-09-07 DIAGNOSIS — K219 Gastro-esophageal reflux disease without esophagitis: Secondary | ICD-10-CM | POA: Diagnosis not present

## 2019-09-07 DIAGNOSIS — I10 Essential (primary) hypertension: Secondary | ICD-10-CM | POA: Insufficient documentation

## 2019-09-07 DIAGNOSIS — Z01812 Encounter for preprocedural laboratory examination: Secondary | ICD-10-CM | POA: Diagnosis not present

## 2019-09-07 DIAGNOSIS — Z87891 Personal history of nicotine dependence: Secondary | ICD-10-CM | POA: Diagnosis not present

## 2019-09-07 DIAGNOSIS — Z20822 Contact with and (suspected) exposure to covid-19: Secondary | ICD-10-CM | POA: Insufficient documentation

## 2019-09-07 DIAGNOSIS — E039 Hypothyroidism, unspecified: Secondary | ICD-10-CM | POA: Insufficient documentation

## 2019-09-07 DIAGNOSIS — Z8551 Personal history of malignant neoplasm of bladder: Secondary | ICD-10-CM | POA: Insufficient documentation

## 2019-09-07 DIAGNOSIS — G4730 Sleep apnea, unspecified: Secondary | ICD-10-CM | POA: Insufficient documentation

## 2019-09-07 DIAGNOSIS — Z79899 Other long term (current) drug therapy: Secondary | ICD-10-CM | POA: Insufficient documentation

## 2019-09-07 LAB — BASIC METABOLIC PANEL
Anion gap: 12 (ref 5–15)
BUN: 10 mg/dL (ref 6–20)
CO2: 24 mmol/L (ref 22–32)
Calcium: 9 mg/dL (ref 8.9–10.3)
Chloride: 100 mmol/L (ref 98–111)
Creatinine, Ser: 0.98 mg/dL (ref 0.61–1.24)
GFR calc Af Amer: >60 mL/min (ref >60)
GFR calc non Af Amer: >60 mL/min (ref >60)
Glucose, Bld: 207 mg/dL — ABNORMAL HIGH (ref 70–99)
Potassium: 3.9 mmol/L (ref 3.5–5.1)
Sodium: 136 mmol/L (ref 135–145)

## 2019-09-07 LAB — TYPE AND SCREEN
ABO/RH(D): A POS
Antibody Screen: NEGATIVE

## 2019-09-07 LAB — CBC
HCT: 46.9 % (ref 39.0–52.0)
Hemoglobin: 15.8 g/dL (ref 13.0–17.0)
MCH: 30.5 pg (ref 26.0–34.0)
MCHC: 33.7 g/dL (ref 30.0–36.0)
MCV: 90.5 fL (ref 80.0–100.0)
Platelets: 313 10*3/uL (ref 150–400)
RBC: 5.18 MIL/uL (ref 4.22–5.81)
RDW: 12.4 % (ref 11.5–15.5)
WBC: 8.7 10*3/uL (ref 4.0–10.5)
nRBC: 0 % (ref 0.0–0.2)

## 2019-09-07 LAB — SARS CORONAVIRUS 2 (TAT 6-24 HRS): SARS Coronavirus 2: NEGATIVE

## 2019-09-07 LAB — SURGICAL PCR SCREEN
MRSA, PCR: NEGATIVE
Staphylococcus aureus: POSITIVE — AB

## 2019-09-07 LAB — GLUCOSE, CAPILLARY: Glucose-Capillary: 178 mg/dL — ABNORMAL HIGH (ref 70–99)

## 2019-09-07 NOTE — Progress Notes (Addendum)
PCP - valerie white FNP @ [facility] in [place] [facility]) Cardiologist - Dr.chahaum in [place] seen in several yrs. Urology : Dr. Titus Mould   Chest x-ray - na EKG - 11/03/18 Stress Test - 2-3 yrs. ago ECHO - ?   Cardiac Cath -   Sleep Study - yrs. ago CPAP -  none  Fasting Blood Sugar - 160-280 Checks Blood Sugar __3-4___ times a day  Blood Thinner Instructions: na Aspirin Instructions:    COVID TEST- 09/07/19   Anesthesia review:  Blood pressure Left 166/103 , recheck 166/95 Right:  193/120  Recheck 159/96  The fax for Dr. Teodoro Kil will not go thrugh ;we have called the office, have the correct number and they states to keep trying. This office called stated they got the fax and they don't have any records on him after 2019. No stress or echo. Will request these studies from [facility] in [place] per Karel Jarvis PA request.  Patient denies shortness of breath, fever, cough and chest pain at PAT appointment   All instructions explained to the patient, with a verbal understanding of the material. Patient agrees to go over the instructions while at home for a better understanding. Patient also instructed to self quarantine after being tested for COVID-19. The opportunity to ask questions was provided.

## 2019-09-08 NOTE — Anesthesia Preprocedure Evaluation (Addendum)
Anesthesia Evaluation  Patient identified by MRN, date of birth, ID band Patient awake    Reviewed: Allergy & Precautions, H&P , NPO status , Patient's Chart, lab work & pertinent test results, reviewed documented beta blocker date and time   Airway Mallampati: III  TM Distance: >3 FB Neck ROM: Full    Dental no notable dental hx. (+) Teeth Intact, Dental Advisory Given   Pulmonary sleep apnea and Continuous Positive Airway Pressure Ventilation , former smoker,    Pulmonary exam normal breath sounds clear to auscultation       Cardiovascular Exercise Tolerance: Good hypertension, Pt. on medications and Pt. on home beta blockers  Rhythm:Regular Rate:Normal     Neuro/Psych negative neurological ROS  negative psych ROS   GI/Hepatic Neg liver ROS, GERD  Medicated,  Endo/Other  diabetes, Insulin Dependent, Oral Hypoglycemic AgentsHypothyroidism   Renal/GU negative Renal ROS  negative genitourinary   Musculoskeletal  (+) Arthritis , Osteoarthritis,    Abdominal   Peds  Hematology negative hematology ROS (+)   Anesthesia Other Findings   Reproductive/Obstetrics negative OB ROS                           Anesthesia Physical Anesthesia Plan  ASA: III  Anesthesia Plan: General   Post-op Pain Management:    Induction: Intravenous  PONV Risk Score and Plan: 3 and Ondansetron, Dexamethasone and Midazolam  Airway Management Planned: Oral ETT  Additional Equipment: Arterial line  Intra-op Plan:   Post-operative Plan: Extubation in OR  Informed Consent: I have reviewed the patients History and Physical, chart, labs and discussed the procedure including the risks, benefits and alternatives for the proposed anesthesia with the patient or authorized representative who has indicated his/her understanding and acceptance.     Dental advisory given  Plan Discussed with: CRNA  Anesthesia Plan  Comments: (History of poorly controlled HTN and DMII, followed by PCP Elisabeth Pigeon, FNP. Last seen 08/21/19, discussed upcoming revision lumbar surgery. BP at that visit was 152/78, A1c 7.9. Per PCP note he is on metformin and sliding scale insulin. Advised to continue current medical management.   Pt says that in 2018 his PCP referred him to cardiologist Dr. Ninfa Meeker in [place] for eval of possible murmur. He says he had a stress test that was normal and he remembers being told by Dr. Janith Lima that he did not have a murmur. He currently denies any CV symptoms, has no hx of CAD, no exertional symptoms. Dr. Richrd Sox office was contacted for records and they advised that did not have any cardiac studies on file for the patient. Records also requested from [facility] where Dr. Janith Lima is affiliated but they had no cardiac studies on file. Prior to recent back surgery, case was discussed with Dr. Linna Caprice who advised pt was okay to proceed as long as he remained asymptomatic from CV standpoint. He underwent L5-S1 ALIF 11/07/18. Surgery was uneventful. He did have post-op ileus, which resolved on 11/12/2018 without intervention.   BP at PAT was 166/95 on recheck. Review of recent records show this is essentially his baseline. He has a monitor at home and was advised to call PCP if pressure remains elevated.  Preop labs reviewed. BG elevated at 207 c/w DMII. Otherwise unremarkable.   EKG 11/03/18: Normal sinus rhythm with sinus arrhythmia. Rate 92.)       Anesthesia Quick Evaluation

## 2019-09-08 NOTE — Progress Notes (Signed)
Anesthesia Chart Review:  History of poorly controlled HTN and DMII, followed by PCP Elisabeth Pigeon, FNP. Last seen 08/21/19, discussed upcoming revision lumbar surgery. BP at that visit was 152/78, A1c 7.9. Per PCP note he is on metformin and sliding scale insulin. Advised to continue current medical management.   Pt says that in 2018 his PCP referred him to cardiologist Dr. Ninfa Meeker in [place] for eval of possible murmur. He says he had a stress test that was normal and he remembers being told by Dr. Janith Lima that he did not have a murmur. He currently denies any CV symptoms, has no hx of CAD, no exertional symptoms. Dr. Richrd Sox office was contacted for records and they advised that did not have any cardiac studies on file for the patient. Records also requested from [facility] where Dr. Janith Lima is affiliated but they had no cardiac studies on file. Prior to previous back surgery, case was discussed with Dr. Linna Caprice who advised pt was okay to proceed as long as he remained asymptomatic from CV standpoint. He underwent L5-S1 ALIF 11/07/18. Surgery was uneventful. He did have post-op ileus, which resolved on 11/12/2018 without intervention.   BP at PAT was 166/95 on recheck. Review of recent records show this is essentially his baseline. He has a monitor at home and was advised to call PCP if pressure remains elevated.  Preop labs reviewed. BG elevated at 207 c/w DMII. Otherwise unremarkable.   EKG 11/03/18: Normal sinus rhythm with sinus arrhythmia. Rate 92.   Wynonia Musty [facility] Short Stay Center/Anesthesiology Phone [phone] 09/08/2019 9:43 AM

## 2019-09-11 ENCOUNTER — Encounter (HOSPITAL_COMMUNITY)
Admission: RE | Disposition: A | Discharge status: Home / Self Care | Payer: Self-pay | Source: Home / Self Care | Attending: Neurological Surgery

## 2019-09-11 ENCOUNTER — Inpatient Hospital Stay (HOSPITAL_COMMUNITY): Payer: Medicare PPO | Admitting: Physician Assistant

## 2019-09-11 ENCOUNTER — Inpatient Hospital Stay (HOSPITAL_COMMUNITY)
Admission: RE | Admit: 2019-09-11 | Discharge: 2019-09-13 | DRG: 460 | Disposition: A | Discharge status: Home / Self Care | Payer: Medicare PPO | Attending: Neurological Surgery | Admitting: Neurological Surgery

## 2019-09-11 ENCOUNTER — Encounter (HOSPITAL_COMMUNITY): Payer: Self-pay | Admitting: Neurological Surgery

## 2019-09-11 ENCOUNTER — Inpatient Hospital Stay (HOSPITAL_COMMUNITY): Payer: Medicare PPO

## 2019-09-11 ENCOUNTER — Inpatient Hospital Stay (HOSPITAL_COMMUNITY): Payer: Medicare PPO | Admitting: Certified Registered"

## 2019-09-11 ENCOUNTER — Other Ambulatory Visit: Payer: Self-pay

## 2019-09-11 DIAGNOSIS — M5416 Radiculopathy, lumbar region: Secondary | ICD-10-CM | POA: Diagnosis present

## 2019-09-11 DIAGNOSIS — E119 Type 2 diabetes mellitus without complications: Secondary | ICD-10-CM | POA: Diagnosis present

## 2019-09-11 DIAGNOSIS — G8929 Other chronic pain: Secondary | ICD-10-CM | POA: Diagnosis present

## 2019-09-11 DIAGNOSIS — Z87442 Personal history of urinary calculi: Secondary | ICD-10-CM | POA: Diagnosis not present

## 2019-09-11 DIAGNOSIS — Y838 Other surgical procedures as the cause of abnormal reaction of the patient, or of later complication, without mention of misadventure at the time of the procedure: Secondary | ICD-10-CM | POA: Diagnosis present

## 2019-09-11 DIAGNOSIS — Z794 Long term (current) use of insulin: Secondary | ICD-10-CM

## 2019-09-11 DIAGNOSIS — G4730 Sleep apnea, unspecified: Secondary | ICD-10-CM | POA: Diagnosis present

## 2019-09-11 DIAGNOSIS — Z87891 Personal history of nicotine dependence: Secondary | ICD-10-CM

## 2019-09-11 DIAGNOSIS — T84038A Mechanical loosening of other internal prosthetic joint, initial encounter: Secondary | ICD-10-CM | POA: Diagnosis present

## 2019-09-11 DIAGNOSIS — M960 Pseudarthrosis after fusion or arthrodesis: Secondary | ICD-10-CM | POA: Diagnosis present

## 2019-09-11 DIAGNOSIS — Z981 Arthrodesis status: Secondary | ICD-10-CM | POA: Diagnosis not present

## 2019-09-11 DIAGNOSIS — I10 Essential (primary) hypertension: Secondary | ICD-10-CM | POA: Diagnosis present

## 2019-09-11 DIAGNOSIS — Z7982 Long term (current) use of aspirin: Secondary | ICD-10-CM

## 2019-09-11 DIAGNOSIS — M1990 Unspecified osteoarthritis, unspecified site: Secondary | ICD-10-CM | POA: Diagnosis present

## 2019-09-11 DIAGNOSIS — S32009K Unspecified fracture of unspecified lumbar vertebra, subsequent encounter for fracture with nonunion: Secondary | ICD-10-CM | POA: Diagnosis present

## 2019-09-11 DIAGNOSIS — Z79890 Hormone replacement therapy (postmenopausal): Secondary | ICD-10-CM | POA: Diagnosis not present

## 2019-09-11 DIAGNOSIS — Z419 Encounter for procedure for purposes other than remedying health state, unspecified: Secondary | ICD-10-CM

## 2019-09-11 DIAGNOSIS — Z79899 Other long term (current) drug therapy: Secondary | ICD-10-CM | POA: Diagnosis not present

## 2019-09-11 DIAGNOSIS — K219 Gastro-esophageal reflux disease without esophagitis: Secondary | ICD-10-CM | POA: Diagnosis present

## 2019-09-11 DIAGNOSIS — Z888 Allergy status to other drugs, medicaments and biological substances status: Secondary | ICD-10-CM | POA: Diagnosis not present

## 2019-09-11 DIAGNOSIS — Z8249 Family history of ischemic heart disease and other diseases of the circulatory system: Secondary | ICD-10-CM

## 2019-09-11 DIAGNOSIS — E039 Hypothyroidism, unspecified: Secondary | ICD-10-CM | POA: Diagnosis present

## 2019-09-11 LAB — GLUCOSE, CAPILLARY
Glucose-Capillary: 155 mg/dL — ABNORMAL HIGH (ref 70–99)
Glucose-Capillary: 160 mg/dL — ABNORMAL HIGH (ref 70–99)
Glucose-Capillary: 140 mg/dL — ABNORMAL HIGH (ref 70–99)
Glucose-Capillary: 177 mg/dL — ABNORMAL HIGH (ref 70–99)
Glucose-Capillary: 125 mg/dL — ABNORMAL HIGH (ref 70–99)
Glucose-Capillary: 132 mg/dL — ABNORMAL HIGH (ref 70–99)
Glucose-Capillary: 169 mg/dL — ABNORMAL HIGH (ref 70–99)
Glucose-Capillary: 200 mg/dL — ABNORMAL HIGH (ref 70–99)
Glucose-Capillary: 244 mg/dL — ABNORMAL HIGH (ref 70–99)

## 2019-09-11 LAB — HEMOGLOBIN A1C
Hgb A1c MFr Bld: 7.9 % — ABNORMAL HIGH (ref 4.8–5.6)
Mean Plasma Glucose: 180.03 mg/dL

## 2019-09-11 SURGERY — POSTERIOR LUMBAR FUSION 3 LEVEL
Anesthesia: General | Site: Back

## 2019-09-11 MED ORDER — THROMBIN 20000 UNITS EX SOLR
CUTANEOUS | Status: DC | PRN
  Administered 2019-09-11: 20 mL via TOPICAL

## 2019-09-11 MED ORDER — LEVOTHYROXINE SODIUM 75 MCG PO TABS
75.0000 ug | ORAL_TABLET | Freq: Every day | ORAL | Status: DC
  Administered 2019-09-12 – 2019-09-13 (×2): 75 ug via ORAL
  Filled 2019-09-11 (×2): qty 1

## 2019-09-11 MED ORDER — FLEET ENEMA 7-19 GM/118ML RE ENEM: 1.0000 | ENEMA | Freq: Once | RECTAL | Status: DC | PRN

## 2019-09-11 MED ORDER — VANCOMYCIN HCL IN DEXTROSE 1-5 GM/200ML-% IV SOLN
1000.0000 mg | Freq: Once | INTRAVENOUS | Status: AC
  Administered 2019-09-11: 1000 mg via INTRAVENOUS
  Filled 2019-09-11: qty 200

## 2019-09-11 MED ORDER — TAMSULOSIN HCL 0.4 MG PO CAPS
0.4000 mg | ORAL_CAPSULE | Freq: Every evening | ORAL | Status: DC
  Administered 2019-09-11 – 2019-09-12 (×2): 0.4 mg via ORAL
  Filled 2019-09-11 (×2): qty 1

## 2019-09-11 MED ORDER — SODIUM CHLORIDE 0.9% FLUSH: 3.0000 mL | INTRAVENOUS | Status: DC | PRN

## 2019-09-11 MED ORDER — ROCURONIUM BROMIDE 50 MG/5ML IV SOSY
PREFILLED_SYRINGE | INTRAVENOUS | Status: DC | PRN
  Administered 2019-09-11: 90 mg via INTRAVENOUS
  Administered 2019-09-11: 20 mg via INTRAVENOUS
  Administered 2019-09-11 (×2): 30 mg via INTRAVENOUS

## 2019-09-11 MED ORDER — ROCURONIUM BROMIDE 10 MG/ML (PF) SYRINGE
PREFILLED_SYRINGE | INTRAVENOUS | Status: AC
  Filled 2019-09-11: qty 10

## 2019-09-11 MED ORDER — PHENYLEPHRINE HCL-NACL 10-0.9 MG/250ML-% IV SOLN
INTRAVENOUS | Status: DC | PRN
  Administered 2019-09-11: 50 ug/min via INTRAVENOUS
  Administered 2019-09-11: 75 ug/min via INTRAVENOUS

## 2019-09-11 MED ORDER — PROPOFOL 10 MG/ML IV BOLUS
INTRAVENOUS | Status: DC | PRN
  Administered 2019-09-11: 200 mg via INTRAVENOUS

## 2019-09-11 MED ORDER — FENTANYL CITRATE (PF) 100 MCG/2ML IJ SOLN
INTRAMUSCULAR | Status: DC | PRN
  Administered 2019-09-11: 150 ug via INTRAVENOUS
  Administered 2019-09-11 (×2): 50 ug via INTRAVENOUS

## 2019-09-11 MED ORDER — THROMBIN 20000 UNITS EX SOLR
CUTANEOUS | Status: AC
  Filled 2019-09-11: qty 20000

## 2019-09-11 MED ORDER — VASOPRESSIN 20 UNIT/ML IV SOLN
0.0100 [IU]/min | INTRAVENOUS | Status: AC
  Administered 2019-09-11: 09:00:00 .03 [IU]/min via INTRAVENOUS
  Filled 2019-09-11: qty 2

## 2019-09-11 MED ORDER — SUGAMMADEX SODIUM 200 MG/2ML IV SOLN
INTRAVENOUS | Status: DC | PRN
  Administered 2019-09-11: 200 mg via INTRAVENOUS

## 2019-09-11 MED ORDER — LACTATED RINGERS IV SOLN: INTRAVENOUS | Status: DC

## 2019-09-11 MED ORDER — LIDOCAINE-EPINEPHRINE 1 %-1:100000 IJ SOLN
INTRAMUSCULAR | Status: AC
  Filled 2019-09-11: qty 1

## 2019-09-11 MED ORDER — LACTATED RINGERS IV SOLN: INTRAVENOUS | Status: DC | PRN

## 2019-09-11 MED ORDER — SENNA 8.6 MG PO TABS
1.0000 | ORAL_TABLET | Freq: Two times a day (BID) | ORAL | Status: DC
  Administered 2019-09-11 – 2019-09-13 (×4): 8.6 mg via ORAL
  Filled 2019-09-11 (×4): qty 1

## 2019-09-11 MED ORDER — DOCUSATE SODIUM 100 MG PO CAPS
100.0000 mg | ORAL_CAPSULE | Freq: Two times a day (BID) | ORAL | Status: DC
  Administered 2019-09-11 – 2019-09-13 (×4): 100 mg via ORAL
  Filled 2019-09-11 (×4): qty 1

## 2019-09-11 MED ORDER — VASOPRESSIN 20 UNIT/ML IV SOLN
INTRAVENOUS | Status: DC | PRN
  Administered 2019-09-11 (×9): 1 [IU] via INTRAVENOUS

## 2019-09-11 MED ORDER — SODIUM CHLORIDE 0.9% FLUSH: 3.0000 mL | Freq: Two times a day (BID) | INTRAVENOUS | Status: DC

## 2019-09-11 MED ORDER — VANCOMYCIN HCL IN DEXTROSE 1-5 GM/200ML-% IV SOLN
1000.0000 mg | INTRAVENOUS | Status: AC
  Administered 2019-09-11: 1000 mg via INTRAVENOUS
  Filled 2019-09-11: qty 200

## 2019-09-11 MED ORDER — METHOCARBAMOL 1000 MG/10ML IJ SOLN
500.0000 mg | Freq: Four times a day (QID) | INTRAVENOUS | Status: DC | PRN
  Filled 2019-09-11: qty 5

## 2019-09-11 MED ORDER — NORTRIPTYLINE HCL 10 MG PO CAPS
10.0000 mg | ORAL_CAPSULE | Freq: Every day | ORAL | Status: DC
  Administered 2019-09-11 – 2019-09-12 (×2): 10 mg via ORAL
  Filled 2019-09-11 (×3): qty 1

## 2019-09-11 MED ORDER — ONDANSETRON HCL 4 MG/2ML IJ SOLN
INTRAMUSCULAR | Status: DC | PRN
  Administered 2019-09-11: 4 mg via INTRAVENOUS

## 2019-09-11 MED ORDER — SODIUM CHLORIDE 0.9 % IV SOLN
INTRAVENOUS | Status: DC | PRN
  Administered 2019-09-11: 500 mL

## 2019-09-11 MED ORDER — ATORVASTATIN CALCIUM 40 MG PO TABS
40.0000 mg | ORAL_TABLET | Freq: Every day | ORAL | Status: DC
  Administered 2019-09-11 – 2019-09-12 (×2): 40 mg via ORAL
  Filled 2019-09-11 (×2): qty 1

## 2019-09-11 MED ORDER — ARTIFICIAL TEARS OPHTHALMIC OINT
TOPICAL_OINTMENT | OPHTHALMIC | Status: DC | PRN
  Administered 2019-09-11: 1 via OPHTHALMIC

## 2019-09-11 MED ORDER — CELECOXIB 200 MG PO CAPS
200.0000 mg | ORAL_CAPSULE | Freq: Once | ORAL | Status: AC
  Administered 2019-09-11: 200 mg via ORAL
  Filled 2019-09-11: qty 1

## 2019-09-11 MED ORDER — PANTOPRAZOLE SODIUM 40 MG PO TBEC
40.0000 mg | DELAYED_RELEASE_TABLET | Freq: Every day | ORAL | Status: DC
  Administered 2019-09-12 – 2019-09-13 (×2): 40 mg via ORAL
  Filled 2019-09-11 (×2): qty 1

## 2019-09-11 MED ORDER — LIDOCAINE-EPINEPHRINE 1 %-1:100000 IJ SOLN
INTRAMUSCULAR | Status: DC | PRN
  Administered 2019-09-11: 5 mL

## 2019-09-11 MED ORDER — PHENOL 1.4 % MT LIQD: 1.0000 | OROMUCOSAL | Status: DC | PRN

## 2019-09-11 MED ORDER — THROMBIN 5000 UNITS EX SOLR
CUTANEOUS | Status: AC
  Filled 2019-09-11: qty 5000

## 2019-09-11 MED ORDER — DIPHENHYDRAMINE HCL 50 MG PO CAPS
75.0000 mg | ORAL_CAPSULE | Freq: Every day | ORAL | Status: DC
  Administered 2019-09-12 – 2019-09-13 (×2): 75 mg via ORAL
  Filled 2019-09-11 (×2): qty 1

## 2019-09-11 MED ORDER — ACETAMINOPHEN 500 MG PO TABS
1000.0000 mg | ORAL_TABLET | Freq: Once | ORAL | Status: AC
  Administered 2019-09-11: 1000 mg via ORAL
  Filled 2019-09-11: qty 2

## 2019-09-11 MED ORDER — BUPIVACAINE HCL (PF) 0.5 % IJ SOLN
INTRAMUSCULAR | Status: DC | PRN
  Administered 2019-09-11: 20 mL
  Administered 2019-09-11: 5 mL

## 2019-09-11 MED ORDER — METOPROLOL TARTRATE 50 MG PO TABS
50.0000 mg | ORAL_TABLET | Freq: Every day | ORAL | Status: DC
  Administered 2019-09-11 – 2019-09-13 (×3): 50 mg via ORAL
  Filled 2019-09-11: qty 1
  Filled 2019-09-11: qty 2
  Filled 2019-09-11 (×2): qty 1

## 2019-09-11 MED ORDER — OXYCODONE-ACETAMINOPHEN 5-325 MG PO TABS
1.0000 | ORAL_TABLET | ORAL | Status: DC | PRN
  Administered 2019-09-11 – 2019-09-13 (×12): 2 via ORAL
  Filled 2019-09-11 (×12): qty 2

## 2019-09-11 MED ORDER — INSULIN ASPART 100 UNIT/ML ~~LOC~~ SOLN
4.0000 [IU] | Freq: Three times a day (TID) | SUBCUTANEOUS | Status: DC
  Administered 2019-09-11 – 2019-09-13 (×6): 4 [IU] via SUBCUTANEOUS

## 2019-09-11 MED ORDER — NOREPINEPHRINE 4 MG/250ML-% IV SOLN
INTRAVENOUS | Status: DC | PRN
  Administered 2019-09-11: 3 ug/min via INTRAVENOUS

## 2019-09-11 MED ORDER — MIDAZOLAM HCL 2 MG/2ML IJ SOLN
INTRAMUSCULAR | Status: AC
  Filled 2019-09-11: qty 2

## 2019-09-11 MED ORDER — THROMBIN 5000 UNITS EX SOLR
OROMUCOSAL | Status: DC | PRN
  Administered 2019-09-11: 5 mL via TOPICAL

## 2019-09-11 MED ORDER — PROPOFOL 10 MG/ML IV BOLUS
INTRAVENOUS | Status: AC
  Filled 2019-09-11: qty 40

## 2019-09-11 MED ORDER — LIDOCAINE 2% (20 MG/ML) 5 ML SYRINGE
INTRAMUSCULAR | Status: DC | PRN
  Administered 2019-09-11: 60 mg via INTRAVENOUS

## 2019-09-11 MED ORDER — MAGNESIUM CHLORIDE 64 MG PO TBEC: 1.0000 | DELAYED_RELEASE_TABLET | Freq: Every day | ORAL | Status: DC

## 2019-09-11 MED ORDER — ACETAMINOPHEN 325 MG PO TABS: 650.0000 mg | ORAL_TABLET | ORAL | Status: DC | PRN

## 2019-09-11 MED ORDER — MORPHINE SULFATE (PF) 2 MG/ML IV SOLN
2.0000 mg | INTRAVENOUS | Status: DC | PRN
  Administered 2019-09-11: 4 mg via INTRAVENOUS
  Filled 2019-09-11: qty 2

## 2019-09-11 MED ORDER — PREGABALIN 100 MG PO CAPS
200.0000 mg | ORAL_CAPSULE | Freq: Two times a day (BID) | ORAL | Status: DC
  Administered 2019-09-11 – 2019-09-13 (×4): 200 mg via ORAL
  Filled 2019-09-11 (×4): qty 2

## 2019-09-11 MED ORDER — INSULIN REGULAR(HUMAN) IN NACL 100-0.9 UT/100ML-% IV SOLN
INTRAVENOUS | Status: DC | PRN
Start: 2019-09-11 — End: 2019-09-11
  Administered 2019-09-11: 6.5 [IU]/h via INTRAVENOUS

## 2019-09-11 MED ORDER — CYCLOBENZAPRINE HCL 10 MG PO TABS: 10.0000 mg | ORAL_TABLET | Freq: Three times a day (TID) | ORAL | Status: DC | PRN

## 2019-09-11 MED ORDER — EPHEDRINE 5 MG/ML INJ
INTRAVENOUS | Status: AC
  Filled 2019-09-11: qty 20

## 2019-09-11 MED ORDER — ALBUTEROL SULFATE HFA 108 (90 BASE) MCG/ACT IN AERS
INHALATION_SPRAY | RESPIRATORY_TRACT | Status: DC | PRN
  Administered 2019-09-11: 4 via RESPIRATORY_TRACT

## 2019-09-11 MED ORDER — METHOCARBAMOL 500 MG PO TABS
500.0000 mg | ORAL_TABLET | Freq: Four times a day (QID) | ORAL | Status: DC | PRN
  Administered 2019-09-11 – 2019-09-13 (×6): 500 mg via ORAL
  Filled 2019-09-11 (×6): qty 1

## 2019-09-11 MED ORDER — ACETAMINOPHEN 650 MG RE SUPP: 650.0000 mg | RECTAL | Status: DC | PRN

## 2019-09-11 MED ORDER — ONDANSETRON HCL 4 MG/2ML IJ SOLN: 4.0000 mg | Freq: Four times a day (QID) | INTRAMUSCULAR | Status: DC | PRN

## 2019-09-11 MED ORDER — INSULIN ASPART 100 UNIT/ML ~~LOC~~ SOLN
0.0000 [IU] | Freq: Three times a day (TID) | SUBCUTANEOUS | Status: DC
  Administered 2019-09-11 – 2019-09-13 (×5): 3 [IU] via SUBCUTANEOUS
  Administered 2019-09-13: 5 [IU] via SUBCUTANEOUS

## 2019-09-11 MED ORDER — BUPIVACAINE HCL (PF) 0.5 % IJ SOLN
INTRAMUSCULAR | Status: AC
  Filled 2019-09-11: qty 30

## 2019-09-11 MED ORDER — KETOROLAC TROMETHAMINE 15 MG/ML IJ SOLN
15.0000 mg | Freq: Four times a day (QID) | INTRAMUSCULAR | Status: AC
  Administered 2019-09-11 – 2019-09-12 (×3): 15 mg via INTRAVENOUS
  Filled 2019-09-11 (×3): qty 1

## 2019-09-11 MED ORDER — CHLORHEXIDINE GLUCONATE CLOTH 2 % EX PADS: 6.0000 | MEDICATED_PAD | Freq: Once | CUTANEOUS | Status: DC

## 2019-09-11 MED ORDER — POLYETHYLENE GLYCOL 3350 17 G PO PACK: 17.0000 g | PACK | Freq: Every day | ORAL | Status: DC | PRN

## 2019-09-11 MED ORDER — DEXAMETHASONE SODIUM PHOSPHATE 10 MG/ML IJ SOLN: INTRAMUSCULAR | Status: DC | PRN

## 2019-09-11 MED ORDER — ALBUMIN HUMAN 5 % IV SOLN: INTRAVENOUS | Status: DC | PRN

## 2019-09-11 MED ORDER — VASOPRESSIN 20 UNIT/ML IV SOLN
INTRAVENOUS | Status: AC
  Filled 2019-09-11: qty 1

## 2019-09-11 MED ORDER — DEXAMETHASONE SODIUM PHOSPHATE 10 MG/ML IJ SOLN
INTRAMUSCULAR | Status: DC | PRN
Start: 2019-09-11 — End: 2019-09-11
  Administered 2019-09-11: 5 mg via INTRAVENOUS

## 2019-09-11 MED ORDER — VITAMIN D 25 MCG (1000 UNIT) PO TABS
5000.0000 [IU] | ORAL_TABLET | Freq: Every day | ORAL | Status: DC
  Administered 2019-09-12 – 2019-09-13 (×2): 5000 [IU] via ORAL
  Filled 2019-09-11 (×2): qty 5

## 2019-09-11 MED ORDER — BISACODYL 10 MG RE SUPP: 10.0000 mg | Freq: Every day | RECTAL | Status: DC | PRN

## 2019-09-11 MED ORDER — HYDROMORPHONE HCL 1 MG/ML IJ SOLN: 0.2500 mg | INTRAMUSCULAR | Status: DC | PRN

## 2019-09-11 MED ORDER — LIDOCAINE 2% (20 MG/ML) 5 ML SYRINGE
INTRAMUSCULAR | Status: AC
  Filled 2019-09-11: qty 5

## 2019-09-11 MED ORDER — ONDANSETRON HCL 4 MG PO TABS: 4.0000 mg | ORAL_TABLET | Freq: Four times a day (QID) | ORAL | Status: DC | PRN

## 2019-09-11 MED ORDER — SODIUM CHLORIDE 0.9 % IV SOLN: 250.0000 mL | INTRAVENOUS | Status: DC

## 2019-09-11 MED ORDER — INSULIN GLARGINE 100 UNIT/ML ~~LOC~~ SOLN
30.0000 [IU] | Freq: Once | SUBCUTANEOUS | Status: AC
  Administered 2019-09-11: 30 [IU] via SUBCUTANEOUS
  Filled 2019-09-11: qty 0.3

## 2019-09-11 MED ORDER — EPHEDRINE SULFATE 50 MG/ML IJ SOLN
INTRAMUSCULAR | Status: DC | PRN
Start: 2019-09-11 — End: 2019-09-11
  Administered 2019-09-11 (×2): 20 mg via INTRAVENOUS
  Administered 2019-09-11 (×4): 10 mg via INTRAVENOUS

## 2019-09-11 MED ORDER — MIDAZOLAM HCL 5 MG/5ML IJ SOLN
INTRAMUSCULAR | Status: DC | PRN
  Administered 2019-09-11: 2 mg via INTRAVENOUS

## 2019-09-11 MED ORDER — PHENYLEPHRINE HCL (PRESSORS) 10 MG/ML IV SOLN
INTRAVENOUS | Status: DC | PRN
  Administered 2019-09-11 (×2): 120 ug via INTRAVENOUS
  Administered 2019-09-11: 80 ug via INTRAVENOUS

## 2019-09-11 MED ORDER — INSULIN ASPART 100 UNIT/ML ~~LOC~~ SOLN: 0.0000 [IU] | Freq: Three times a day (TID) | SUBCUTANEOUS | Status: DC

## 2019-09-11 MED ORDER — FLUTICASONE PROPIONATE 50 MCG/ACT NA SUSP: 1.0000 | Freq: Every day | NASAL | Status: DC | PRN

## 2019-09-11 MED ORDER — PHENYLEPHRINE HCL (PRESSORS) 10 MG/ML IV SOLN
INTRAVENOUS | Status: AC
  Filled 2019-09-11: qty 1

## 2019-09-11 MED ORDER — INSULIN ASPART 100 UNIT/ML ~~LOC~~ SOLN
0.0000 [IU] | Freq: Every day | SUBCUTANEOUS | Status: DC
  Administered 2019-09-11: 2 [IU] via SUBCUTANEOUS

## 2019-09-11 MED ORDER — 0.9 % SODIUM CHLORIDE (POUR BTL) OPTIME
TOPICAL | Status: DC | PRN
  Administered 2019-09-11: 09:00:00 1000 mL

## 2019-09-11 MED ORDER — METFORMIN HCL 500 MG PO TABS
500.0000 mg | ORAL_TABLET | Freq: Two times a day (BID) | ORAL | Status: DC
  Administered 2019-09-11 – 2019-09-13 (×4): 500 mg via ORAL
  Filled 2019-09-11 (×4): qty 1

## 2019-09-11 MED ORDER — MENTHOL 3 MG MT LOZG: 1.0000 | LOZENGE | OROMUCOSAL | Status: DC | PRN

## 2019-09-11 MED ORDER — FENTANYL CITRATE (PF) 250 MCG/5ML IJ SOLN
INTRAMUSCULAR | Status: AC
  Filled 2019-09-11: qty 5

## 2019-09-11 SURGICAL SUPPLY — 72 items
ADH SKN CLS APL DERMABOND .7 (GAUZE/BANDAGES/DRESSINGS) ×1
APL SRG 60D 8 XTD TIP BNDBL (TIP)
BAG DECANTER FOR FLEXI CONT (MISCELLANEOUS) ×3 IMPLANT
BASKET BONE COLLECTION (BASKET) ×3 IMPLANT
BLADE CLIPPER SURG (BLADE) IMPLANT
BONE CANC CHIPS 20CC PCAN1/4 (Bone Implant) ×3 IMPLANT
BUR MATCHSTICK NEURO 3.0 LAGG (BURR) ×3 IMPLANT
CANISTER SUCT 3000ML PPV (MISCELLANEOUS) ×3 IMPLANT
CHIPS CANC BONE 20CC PCAN1/4 (Bone Implant) ×1 IMPLANT
CNTNR URN SCR LID CUP LEK RST (MISCELLANEOUS) ×1 IMPLANT
CONNECTOR RELINE 20MM OPEN OFF (Connector) ×2 IMPLANT
CONNECTOR RELINE 30MM OPEN OFF (Connector) ×2 IMPLANT
CONT SPEC 4OZ STRL OR WHT (MISCELLANEOUS) ×3
COVER BACK TABLE 60X90IN (DRAPES) ×3 IMPLANT
COVER WAND RF STERILE (DRAPES) ×3 IMPLANT
DECANTER SPIKE VIAL GLASS SM (MISCELLANEOUS) ×3 IMPLANT
DERMABOND ADVANCED (GAUZE/BANDAGES/DRESSINGS) ×2
DERMABOND ADVANCED .7 DNX12 (GAUZE/BANDAGES/DRESSINGS) ×1 IMPLANT
DEVICE DISSECT PLASMABLAD 3.0S (MISCELLANEOUS) ×1 IMPLANT
DIGITIZER BENDINI (MISCELLANEOUS) ×3 IMPLANT
DRAPE C-ARM 42X72 X-RAY (DRAPES) ×6 IMPLANT
DRAPE HALF SHEET 40X57 (DRAPES) ×3 IMPLANT
DRAPE LAPAROTOMY 100X72X124 (DRAPES) ×3 IMPLANT
DURAPREP 26ML APPLICATOR (WOUND CARE) ×3 IMPLANT
DURASEAL APPLICATOR TIP (TIP) IMPLANT
DURASEAL SPINE SEALANT 3ML (MISCELLANEOUS) IMPLANT
ELECT REM PT RETURN 9FT ADLT (ELECTROSURGICAL) ×3
ELECTRODE REM PT RTRN 9FT ADLT (ELECTROSURGICAL) ×1 IMPLANT
GAUZE 4X4 16PLY RFD (DISPOSABLE) IMPLANT
GAUZE SPONGE 4X4 12PLY STRL (GAUZE/BANDAGES/DRESSINGS) ×3 IMPLANT
GLOVE BIOGEL PI IND STRL 8 (GLOVE) IMPLANT
GLOVE BIOGEL PI IND STRL 8.5 (GLOVE) ×2 IMPLANT
GLOVE BIOGEL PI INDICATOR 8 (GLOVE) ×2
GLOVE BIOGEL PI INDICATOR 8.5 (GLOVE) ×4
GLOVE ECLIPSE 7.5 STRL STRAW (GLOVE) ×10 IMPLANT
GLOVE ECLIPSE 8.5 STRL (GLOVE) ×6 IMPLANT
GOWN STRL REUS W/ TWL LRG LVL3 (GOWN DISPOSABLE) IMPLANT
GOWN STRL REUS W/ TWL XL LVL3 (GOWN DISPOSABLE) IMPLANT
GOWN STRL REUS W/TWL 2XL LVL3 (GOWN DISPOSABLE) ×9 IMPLANT
GOWN STRL REUS W/TWL LRG LVL3 (GOWN DISPOSABLE)
GOWN STRL REUS W/TWL XL LVL3 (GOWN DISPOSABLE)
GRAFT BNE CANC CHIPS 1-8 20CC (Bone Implant) IMPLANT
HEMOSTAT POWDER KIT SURGIFOAM (HEMOSTASIS) ×3 IMPLANT
KIT BASIN OR (CUSTOM PROCEDURE TRAY) ×3 IMPLANT
KIT INFUSE SMALL (Orthopedic Implant) ×2 IMPLANT
KIT TURNOVER KIT B (KITS) ×3 IMPLANT
MILL MEDIUM DISP (BLADE) ×1 IMPLANT
NEEDLE HYPO 22GX1.5 SAFETY (NEEDLE) ×3 IMPLANT
NS IRRIG 1000ML POUR BTL (IV SOLUTION) ×3 IMPLANT
PACK LAMINECTOMY NEURO (CUSTOM PROCEDURE TRAY) ×3 IMPLANT
PAD ARMBOARD 7.5X6 YLW CONV (MISCELLANEOUS) ×3 IMPLANT
PATTIES SURGICAL .5 X1 (DISPOSABLE) ×3 IMPLANT
PLASMABLADE 3.0S (MISCELLANEOUS) ×3
ROD RELINE-O 5.5X300 STRT NS (Rod) IMPLANT
ROD RELINE-O 5.5X300MM STRT (Rod) ×6 IMPLANT
SCREW LOCK RELINE 5.5 TULIP (Screw) ×24 IMPLANT
SCREW RELINE O POLY 8.5X50MM (Screw) ×3 IMPLANT
SCREW RELINE-0 POLY 7.5X70 (Screw) ×6 IMPLANT
SCREW RELINE-O POLY 7.5X45 (Screw) ×6 IMPLANT
SPONGE LAP 4X18 RFD (DISPOSABLE) IMPLANT
SPONGE SURGIFOAM ABS GEL 100 (HEMOSTASIS) ×3 IMPLANT
SUT PROLENE 6 0 BV (SUTURE) IMPLANT
SUT VIC AB 1 CT1 18XBRD ANBCTR (SUTURE) ×1 IMPLANT
SUT VIC AB 1 CT1 8-18 (SUTURE) ×3
SUT VIC AB 2-0 CP2 18 (SUTURE) ×3 IMPLANT
SUT VIC AB 3-0 SH 8-18 (SUTURE) ×3 IMPLANT
SUT VIC AB 4-0 RB1 18 (SUTURE) ×4 IMPLANT
SYR 3ML LL SCALE MARK (SYRINGE) ×10 IMPLANT
TOWEL GREEN STERILE (TOWEL DISPOSABLE) ×3 IMPLANT
TOWEL GREEN STERILE FF (TOWEL DISPOSABLE) ×3 IMPLANT
TRAY FOLEY MTR SLVR 16FR STAT (SET/KITS/TRAYS/PACK) ×3 IMPLANT
WATER STERILE IRR 1000ML POUR (IV SOLUTION) ×3 IMPLANT

## 2019-09-11 NOTE — Progress Notes (Signed)
Inpatient Diabetes Program Recommendations  AACE/ADA: New Consensus Statement on Inpatient Glycemic Control (2015)  Target Ranges:  Prepandial:   less than 140 mg/dL      Peak postprandial:   less than 180 mg/dL (1-2 hours)      Critically ill patients:  140 - 180 mg/dL   Lab Results  Component Value Date   GLUCAP 132 (H) 09/11/2019   HGBA1C 8.3 (H) 11/03/2018    Review of Glycemic Control Results for DMARCO, FILTZ (MRN [id number]) as of 09/11/2019 13:02  Ref. Range 09/07/2019 10:13 09/11/2019 08:42 09/11/2019 10:26 09/11/2019 12:19  Glucose-Capillary Latest Ref Range: 70 - 99 mg/dL 178 (H) 160 (H) 140 (H) 132 (H)   Diabetes history: DM 2 Outpatient Diabetes medications:  Metformin 500 mg bid, Novolog flexpen 8-22 units tid with meals Current orders for Inpatient glycemic control:  IV insulin- Inpatient Diabetes Program Recommendations:    Recommend Lantus 30 units 1-2 hours prior to d/c of insulin drip.  Also consider Novolog correction tid with meals and HS plus Novolog meal coverage 4 units tid with meals (hold meal coverage if patient eats less than 50% or CBG<80 mg/dL).   Thanks  Adah Perl, RN, BC-ADM Inpatient Diabetes Coordinator Pager [phone] (8a-5p)

## 2019-09-11 NOTE — Evaluation (Signed)
Physical Therapy Evaluation Patient Details Name: Tony Christensen MRN: [id number] DOB: 06-13-1960 Today's Date: 09/11/2019   History of Present Illness  59 yo male s/p revision of pseudoarthrosis L5-S1 with posterior lateral arthrodesis using allograft and infuse and pedicular fixation from L3 to the ileum, Replacement of hardware at S1. PMH includes L3-S1 fusion 2020, bladder and lung cancer, DM, HTN.  Clinical Impression   Pt presents with back pain, increased time and effort to mobilize, unsteadiness in standing with history of falls, and decreased activity tolerance. Pt to benefit from acute PT to address deficits. Pt ambulated good hallway distance with intermittent SL steadying, with occasional R staggering. Per pt, he has 24/7 assist from wife at home. PT to progress mobility as tolerated, and will continue to follow acutely.      Follow Up Recommendations Follow surgeon's recommendation for DC plan and follow-up therapies;Supervision for mobility/OOB    Equipment Recommendations  None recommended by PT    Recommendations for Other Services       Precautions / Restrictions Precautions Precautions: Fall;Back Precaution Booklet Issued: Yes (comment) Precaution Comments: handout administered and reviewed - no bending, lifting, twisting spine, log roll technique in/out of bed Required Braces or Orthoses: Spinal Brace Spinal Brace: Lumbar corset;Applied in sitting position(pt left brace at home, RN Aisha okay-ed mobility POD0 without) Restrictions Weight Bearing Restrictions: No      Mobility  Bed Mobility Overal bed mobility: Needs Assistance Bed Mobility: Rolling;Sidelying to Sit;Sit to Sidelying Rolling: Min guard Sidelying to sit: Min guard;HOB elevated     Sit to sidelying: Min guard;HOB elevated General bed mobility comments: min guard for safety, verbal cuing for safe technique. Increased time and effort.  Transfers Overall transfer level: Needs  assistance Equipment used: 1 person hand held assist Transfers: Sit to/from Stand Sit to Stand: Min guard;From elevated surface         General transfer comment: min guard for safety, slow rise with verbal cuing for bending at hips/knees not back.  Ambulation/Gait Ambulation/Gait assistance: Min guard;Min assist Gait Distance (Feet): 400 Feet Assistive device: None;IV Pole Gait Pattern/deviations: Step-through pattern;Decreased stride length;Trunk flexed;Staggering right Gait velocity: decr   General Gait Details: Min guard for safety, occasional min assist to steady. Pt with x2 staggers R, recovered with PT light assist. Verbal cuing for upright posture, use of IV pole to steady as needed.  Stairs            Wheelchair Mobility    Modified Rankin (Stroke Patients Only)       Balance Overall balance assessment: Needs assistance;History of Falls Sitting-balance support: No upper extremity supported;Feet supported Sitting balance-Leahy Scale: Good     Standing balance support: Single extremity supported Standing balance-Leahy Scale: Fair                               Pertinent Vitals/Pain Pain Assessment: Faces Faces Pain Scale: Hurts little more Pain Location: back Pain Descriptors / Indicators: Sore;Discomfort Pain Intervention(s): Limited activity within patient's tolerance;Monitored during session;Repositioned    Home Living Family/patient expects to be discharged to:: Private residence Living Arrangements: Spouse/significant other Available Help at Discharge: Family;Available 24 hours/day Type of Home: House Home Access: Level entry     Home Layout: One level;Laundry or work area in [place]: Environmental consultant - 2 wheels      Prior Function Level of Independence: Independent with assistive device(s)         Comments:  pt reports using RW intermittently PTA, states he has had 1 fall in the past year. Worked at [employer] until 1995,  reports "they forced me to retire".     Hand Dominance   Dominant Hand: Right    Extremity/Trunk Assessment   Upper Extremity Assessment Upper Extremity Assessment: Overall WFL for tasks assessed    Lower Extremity Assessment Lower Extremity Assessment: Overall WFL for tasks assessed    Cervical / Trunk Assessment Cervical / Trunk Assessment: Other exceptions Cervical / Trunk Exceptions: s/p lumbar fusion revision  Communication   Communication: No difficulties  Cognition Arousal/Alertness: Awake/alert Behavior During Therapy: WFL for tasks assessed/performed Overall Cognitive Status: Within Functional Limits for tasks assessed                                 General Comments: Pt very talkative and tangential, difficult to redirect.      General Comments      Exercises Other Exercises Other Exercises: home walking program: encouraged up and walking 1x/hour for circulation and to prevent stiffness   Assessment/Plan    PT Assessment Patient needs continued PT services  PT Problem List Decreased strength;Decreased mobility;Decreased safety awareness;Decreased activity tolerance;Decreased balance;Decreased knowledge of use of DME;Decreased knowledge of precautions;Pain       PT Treatment Interventions DME instruction;Therapeutic activities;Gait training;Therapeutic exercise;Patient/family education;Balance training;Functional mobility training;Neuromuscular re-education    PT Goals (Current goals can be found in the Care Plan section)  Acute Rehab PT Goals Patient Stated Goal: go home PT Goal Formulation: With patient Time For Goal Achievement: 09/25/19 Potential to Achieve Goals: Good    Frequency Min 5X/week   Barriers to discharge        Co-evaluation               AM-PAC PT "6 Clicks" Mobility  Outcome Measure Help needed turning from your back to your side while in a flat bed without using bedrails?: A Little Help needed moving from  lying on your back to sitting on the side of a flat bed without using bedrails?: A Little Help needed moving to and from a bed to a chair (including a wheelchair)?: A Little Help needed standing up from a chair using your arms (e.g., wheelchair or bedside chair)?: A Little Help needed to walk in hospital room?: A Little Help needed climbing 3-5 steps with a railing? : A Lot 6 Click Score: 17    End of Session Equipment Utilized During Treatment: Gait belt Activity Tolerance: Patient tolerated treatment well Patient left: in bed;with call bell/phone within reach Nurse Communication: Mobility status PT Visit Diagnosis: Other abnormalities of gait and mobility (R26.89);Muscle weakness (generalized) (M62.81);Difficulty in walking, not elsewhere classified (R26.2)    Time: [id number] PT Time Calculation (min) (ACUTE ONLY): 28 min   Charges:   PT Evaluation $PT Eval Low Complexity: 1 Low PT Treatments $Gait Training: 8-22 mins      Tony Christensen E, PT Acute Rehabilitation Services Pager [phone]  Office [phone]  Vicie Cech D Elonda Husky 09/11/2019, 4:52 PM

## 2019-09-11 NOTE — Anesthesia Postprocedure Evaluation (Signed)
Anesthesia Post Note  Patient: Tony Christensen  Procedure(s) Performed: Revision of lumbar fusion Lumbar three to Sacral one with supplemental fixation to Pelvis (N/A Back)     Patient location during evaluation: PACU Anesthesia Type: General Level of consciousness: awake and alert Pain management: pain level controlled Vital Signs Assessment: post-procedure vital signs reviewed and stable Respiratory status: spontaneous breathing, nonlabored ventilation, respiratory function stable and patient connected to nasal cannula oxygen Cardiovascular status: blood pressure returned to baseline and stable Postop Assessment: no apparent nausea or vomiting Anesthetic complications: no    Last Vitals:  Vitals:   09/11/19 1318 09/11/19 1333  BP: 116/60 116/61  Pulse: (!) 105 (!) 102  Resp: 20 18  Temp:  36.6 C  SpO2: 95% 95%    Last Pain:  Vitals:   09/11/19 1333  TempSrc:   PainSc: 4                  Fusaye Wachtel,W. EDMOND

## 2019-09-11 NOTE — Op Note (Signed)
Date of surgery: 09/11/2019 Preoperative diagnosis: Pseudoarthrosis L5-S1 status post anterior interbody arthrodesis with posterior fixation L3 to sacrum. Postoperative diagnosis: Same Procedure: Revision of pseudoarthrosis L5-S1 with posterior lateral arthrodesis using allograft and infuse and pedicular fixation from L3 to the ileum.  Replacement of hardware at S1. Surgeon: Kristeen Miss First Assistant: Kary Kos MD Anesthesia: General endotracheal Indications: Tony Christensen is a 59 year old individual who has a longstanding history of diabetes couple years ago he underwent decompression fusion from L3 to the sacrum with and anterior fusion at L4-5 and L5-S1 he had posterior percutaneous screw fixation from L3 to the sacrum and it appears that he is fused L3-4 and L4-5 but L5-S1 there is some lucency around the graft in addition to loosening of the S1 screws in the sacrum.  He is now being taken to the operating room to undergo surgical revision.  The patient was brought to the operating room supine on the stretcher.  After the smooth induction of general endotracheal anesthesia he was carefully turned prone and the back was prepped with alcohol DuraPrep and draped in a sterile fashion.  A midline incision was created and carried down to the lumbodorsal fascia which was opened on either side of midline to expose L4-L5 and the sacrum and L3 superiorly.  Then by dissecting down the spinous process and lamina in a subperiosteal fashion the hardware was exposed at L4-L5 and the sacrum and then eventually dissected out to L3 to expose the tops of the screws.  Once these were isolated the screw caps were removed and the rods were removed.  Dissection was then taken down inferiorly to expose the sacrum further and in the subfascial incision we then exposed the posterior superior iliac crest.  The crest was palpated to identify the superiormost prominence of the ridge and then the crest was opened on either side  exposed bone which was entered using a Leksell rongeur then by passing a blunt probe in the direction of the sciatic notch the probe was passed to a depth of approximately 80 mm.  Fluoroscopic guidance was used to check the position of the probes and once verified 7.5 x 70 mm screws were passed into the ileum on each side.  With the rods being removed it was evident that the sacral screws were loose and these were each removed on the left side we placed a 7.5 x 45 mm screw in lieu of the 6.5 millimeters screw and on the right side it required an 8.5 mm diameter screw measuring 50 mm in length.  Prior to placing the screw however the soft tissues in the lateral gutters were removed to expose the region of the ala lateral margin of the facet joint at L5-S1 and the transverse process of L5.  These areas were decorticated and after adequate decortication was performed a piece of infuse from the small infuse set was placed into the lateral gutter and covered with approximately 6 cc of allograft bone chips that were cortical cancellous chips.  Then the same process was repeated on the contralateral side.  Some additional bone graft remained and this was placed in the interlaminar spaces at L3-4 and L4-5.  Once this was completed the iliac screws were then connected with a transverse connector on the right side that measured 30 mm and offset on the left side measured 20 mm then Bandini was used to contour the rod between L3 and the ileum first on the left-hand side then on the right-hand side.  The system was installed in a neutral construct and was then tightened down.  In the end hemostasis in the soft tissues was obtained meticulously total blood loss was 400 cc with 150 cc of Cell Saver blood being returned to the patient.  After final inspection and final radiographs were obtained in AP and lateral projection the lumbodorsal fascia was closed with #1 Vicryl in interrupted fashion in the deepest layer 20 cc of half  percent Marcaine was injected into the paraspinous fascia and then 2-0 Vicryl was used in the subcutaneous tissue over the 2 lateral fascial incisions to the iliac crests and then 3-0 Vicryl was used in a subcuticular layers most superficially along with a layer of 4-0 Vicryl in the superficial subcuticular layer Dermabond was placed on the skin patient tolerated procedure well is returned to recovery in stable condition.

## 2019-09-11 NOTE — Transfer of Care (Signed)
Immediate Anesthesia Transfer of Care Note  Patient: Tony Christensen  Procedure(s) Performed: Revision of lumbar fusion Lumbar three to Sacral one with supplemental fixation to Pelvis (N/A Back)  Patient Location: PACU  Anesthesia Type:General  Level of Consciousness: awake, alert , oriented, patient cooperative and responds to stimulation  Airway & Oxygen Therapy: Patient Spontanous Breathing and Patient connected to nasal cannula oxygen  Post-op Assessment: Report given to RN and Post -op Vital signs reviewed and stable  Post vital signs: Reviewed and stable  Last Vitals:  Vitals Value Taken Time  BP 146/88 09/11/19 1217  Temp    Pulse 38 09/11/19 1220  Resp 15 09/11/19 1220  SpO2 96 % 09/11/19 1220  Vitals shown include unvalidated device data.  Last Pain:  Vitals:   09/11/19 0632  TempSrc:   PainSc: 6       Patients Stated Pain Goal: 3 ([id number])  Complications: No apparent anesthesia complications

## 2019-09-11 NOTE — Progress Notes (Signed)
Orthopedic Tech Progress Note Patient Details:  Tony Christensen November 04, 1960 [id number] RN said patient wife is bringing brace Patient ID: [name], male   DOB: 05-31-1960, 58 y.o.   MRN: [id number]   [name] 09/11/2019, 3:50 PM

## 2019-09-11 NOTE — Anesthesia Procedure Notes (Signed)
Arterial Line Insertion Start/End4/26/2021 6:44 AM, 09/11/2019 6:49 AM Performed by: Glynda Jaeger, CRNA, CRNA  Preanesthetic checklist: patient identified, IV checked, site marked, risks and benefits discussed, surgical consent, monitors and equipment checked, pre-op evaluation, timeout performed and anesthesia consent Lidocaine 1% used for infiltration Left, radial was placed Catheter size: 20 G Hand hygiene performed  and maximum sterile barriers used  Allen's test indicative of satisfactory collateral circulation Attempts: 1 Procedure performed without using ultrasound guided technique. Following insertion, dressing applied and Biopatch. Post procedure assessment: normal  Patient tolerated the procedure well with no immediate complications. Additional procedure comments: Placed by Thereasa Solo under CRNA supervision. Marland Kitchen

## 2019-09-11 NOTE — H&P (Signed)
Tony Christensen is an 59 y.o. male.   Chief Complaint: Unremitting back and leg pain HPI: Tony Christensen is a 59 year old individual whose had significant problems with chronic back pain.  He underwent decompression and fusion from L3 to the sacrum a couple of years ago with an anterolateral fusion at L3-4 and anterior interbody arthrodesis at L4-5 and L5-S1.  Posterior fixation was performed from L3 to the sacrum.  He subsequently developed a pseudoarthrosis at L5-S1.  He is now being taken to the operating room to undergo revision of his posterior fixation with fixation to the pelvis and supplemental arthrodesis posterior laterally using infuse.  His efforts to heal this process conservatively have failed despite use of an external fusion stimulator.  Supplemental bracing.  Physical therapy.  Past Medical History:  Diagnosis Date  . Arthritis   . Cancer ([place])    Bladder/lung ( node on right lung-watching)  . Diabetes mellitus without complication ([place])   . GERD (gastroesophageal reflux disease)   . Heart murmur   . History of kidney stones   . Hypertension   . Hypothyroidism   . Kidney cysts   . Kidney stones   . Sleep apnea   . Thyroid disease     Past Surgical History:  Procedure Laterality Date  . ABDOMINAL EXPOSURE N/A 11/07/2018   Procedure: ABDOMINAL EXPOSURE;  Surgeon: Rosetta Posner, MD;  Location: [facility] OR;  Service: Vascular;  Laterality: N/A;  . ANTERIOR LATERAL LUMBAR FUSION WITH PERCUTANEOUS SCREW 3 LEVEL N/A 11/07/2018   Procedure: ANTERIOR LATERAL LUMBAR FUSION LUMBAR THREE-FOUR;  Surgeon: Kristeen Miss, MD;  Location: [facility];  Service: Neurosurgery;  Laterality: N/A;  Lumbar 3-4 Anterolateral lumbar interbody fusion with Lumbar 3 to Sacral 1 Posterior fixation with pedicle screws  . ANTERIOR LUMBAR FUSION N/A 11/07/2018   Procedure: LUMBAR FOUR-FIVE, LUMBAR FIVE-SACRAL ONE ANTERIOR LUMBAR INTERBODY FUSION;  Surgeon: Kristeen Miss, MD;  Location: [facility];  Service: Neurosurgery;   Laterality: N/A;  Lumbar 4-5 Lumbar 5 Sacral 1 Anterior lumbar interbody fusion  . APPLICATION OF ROBOTIC ASSISTANCE FOR SPINAL PROCEDURE N/A 11/07/2018   Procedure: PLACEMENT OF PERCUTANEOUS PEDICLE SCREWS LUMBAR THREE-SACRAL ONE WITH APPLICATION OF ROBOTIC ASSISTANCE;  Surgeon: Kristeen Miss, MD;  Location: [facility];  Service: Neurosurgery;  Laterality: N/A;  . BACK SURGERY    . BLADDER TUMOR EXCISION    . CARPAL TUNNEL WITH CUBITAL TUNNEL Bilateral   . ELBOW SURGERY Bilateral [id number]  . KNEE ARTHROSCOPY W/ MENISCAL REPAIR Right   . NECK SURGERY      Family History  Problem Relation Age of Onset  . Fibromyalgia Mother   . Arthritis Mother   . Brain cancer Father   . Lung cancer Father   . Hypertension Father   . Heart disease Father    Social History:  reports that he quit smoking about 29 years ago. He has never used smokeless tobacco. He reports current alcohol use. He reports that he does not use drugs.  Allergies:  Allergies  Allergen Reactions  . Ceclor [Cefaclor] Anaphylaxis    Medications Prior to Admission  Medication Sig Dispense Refill  . Ascorbic Acid (VITAMIN C WITH ROSE HIPS) 1000 MG tablet Take 2,000 mg by mouth daily.    . Aspirin-Acetaminophen-Caffeine (GOODY HEADACHE PO) Take 1 Package by mouth daily as needed (pain).    Marland Kitchen atorvastatin (LIPITOR) 40 MG tablet Take 40 mg by mouth at bedtime.     . benazepril (LOTENSIN) 10 MG tablet Take 10 mg by  mouth daily.     . Cholecalciferol (VITAMIN D-3) 5000 units TABS Take 5,000 Units by mouth daily.     . cyclobenzaprine (FLEXERIL) 10 MG tablet Take 10 mg by mouth 3 (three) times daily as needed for muscle spasms.     . diphenhydrAMINE (BENADRYL) 25 mg capsule Take 75 mg by mouth daily.     . HONEY PO Take 15 mLs by mouth daily.     Marland Kitchen HYDROcodone-acetaminophen (NORCO) 10-325 MG tablet Take 1 tablet by mouth every 6 (six) hours as needed (pain).    Marland Kitchen levothyroxine (SYNTHROID, LEVOTHROID) 75 MCG tablet Take 75 mcg by mouth  daily before breakfast.     . magnesium chloride (SLOW-MAG) 64 MG TBEC SR tablet Take 1 tablet (64 mg total) by mouth daily. 60 tablet 0  . meloxicam (MOBIC) 7.5 MG tablet Take 7.5 mg by mouth 2 (two) times daily.     . metFORMIN (GLUCOPHAGE) 500 MG tablet Take 500 mg by mouth 2 (two) times daily with a meal.    . metoprolol tartrate (LOPRESSOR) 50 MG tablet Take 50 mg by mouth daily.    . Multiple Vitamins-Minerals (MULTIVITAMIN WITH MINERALS) tablet Take 1 tablet by mouth daily.    . nortriptyline (PAMELOR) 10 MG capsule Take 10 mg by mouth at bedtime.    Marland Kitchen NOVOLOG FLEXPEN 100 UNIT/ML FlexPen Inject 8-22 Units into the skin 3 (three) times daily with meals. Takes with meals as needed    . omeprazole (PRILOSEC) 20 MG capsule Take 20 mg by mouth every morning.    . pregabalin (LYRICA) 200 MG capsule Take 200 mg by mouth 2 (two) times daily.     . tamsulosin (FLOMAX) 0.4 MG CAPS capsule Take 0.4 mg by mouth every evening.    . zinc gluconate 50 MG tablet Take 100 mg by mouth daily.    . fluticasone (FLONASE) 50 MCG/ACT nasal spray Place 1 spray into both nostrils daily as needed for allergies.     Marland Kitchen nystatin (MYCOSTATIN) 100000 UNIT/ML suspension Use as directed 5 mLs in the mouth or throat daily as needed (Mouth pain).       No results found for this or any previous visit (from the past 48 hour(s)). No results found.  Review of Systems  Constitutional: Positive for activity change.  HENT: Negative.   Eyes: Negative.   Respiratory: Negative.   Cardiovascular: Negative.   Gastrointestinal: Negative.   Endocrine: Negative.   Genitourinary: Negative.   Musculoskeletal: Positive for back pain and myalgias.  Allergic/Immunologic: Negative.   Neurological: Positive for weakness and numbness.  Hematological: Negative.   Psychiatric/Behavioral: Negative.     Blood pressure (!) 153/93, pulse 96, temperature 98.3 F (36.8 C), temperature source Oral, resp. rate 18, height 5\' 8"  (1.727 m),  weight 98.9 kg, SpO2 98 %. Physical Exam  Constitutional: He is oriented to person, place, and time. He appears well-developed and well-nourished.  HENT:  Head: Normocephalic and atraumatic.  Eyes: Pupils are equal, round, and reactive to light. Conjunctivae and EOM are normal.  Cardiovascular: Normal rate and regular rhythm.  Respiratory: Effort normal and breath sounds normal.  GI: Soft. Bowel sounds are normal.  Musculoskeletal:     Cervical back: Normal range of motion and neck supple.     Comments: Positive straight leg raising at 15 degrees in either lower extremities Patrick's maneuver is negative bilaterally.  Neurological: He is alert and oriented to person, place, and time.  Absent deep tendon reflexes in the lower  extremities cranial nerve examination is within the limits of normal motor strength is good in iliopsoas to quadriceps tibialis anterior and gastrocs.  Station and gait are abnormal with the patient forward flexed neutral position by about 10 degrees he has difficulty extending upright without increasing pain substantially.  Skin: Skin is warm and dry.  Psychiatric: He has a normal mood and affect. His behavior is normal. Judgment and thought content normal.     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  Plan: Posterior fixation to the ileum with posterior lateral arthrodesis L5-S1.  Earleen Newport, MD 09/11/2019, 7:37 AM

## 2019-09-11 NOTE — Anesthesia Procedure Notes (Signed)
Procedure Name: Intubation Date/Time: 09/11/2019 7:46 AM Performed by: Glynda Jaeger, CRNA Pre-anesthesia Checklist: Patient identified, Emergency Drugs available, Suction available and Patient being monitored Patient Re-evaluated:Patient Re-evaluated prior to induction Oxygen Delivery Method: Circle System Utilized Preoxygenation: Pre-oxygenation with 100% oxygen Induction Type: IV induction Ventilation: Mask ventilation without difficulty Laryngoscope Size: Mac and 4 Grade View: Grade II Tube type: Oral Tube size: 7.5 mm Number of attempts: 1 Airway Equipment and Method: Stylet and Oral airway Placement Confirmation: ETT inserted through vocal cords under direct vision,  positive ETCO2 and breath sounds checked- equal and bilateral Secured at: 23 cm Tube secured with: Tape Dental Injury: Teeth and Oropharynx as per pre-operative assessment  Comments: [place] performed

## 2019-09-11 NOTE — Progress Notes (Signed)
Per patient, CBG after waking up this morning was 245 @ 0330, and took 13 units Novolog SQ.

## 2019-09-12 LAB — BASIC METABOLIC PANEL
Anion gap: 11 (ref 5–15)
BUN: 21 mg/dL — ABNORMAL HIGH (ref 6–20)
CO2: 21 mmol/L — ABNORMAL LOW (ref 22–32)
Calcium: 8.3 mg/dL — ABNORMAL LOW (ref 8.9–10.3)
Chloride: 103 mmol/L (ref 98–111)
Creatinine, Ser: 1.22 mg/dL (ref 0.61–1.24)
GFR calc Af Amer: >60 mL/min (ref >60)
GFR calc non Af Amer: >60 mL/min (ref >60)
Glucose, Bld: 188 mg/dL — ABNORMAL HIGH (ref 70–99)
Potassium: 4 mmol/L (ref 3.5–5.1)
Sodium: 135 mmol/L (ref 135–145)

## 2019-09-12 LAB — GLUCOSE, CAPILLARY
Glucose-Capillary: 190 mg/dL — ABNORMAL HIGH (ref 70–99)
Glucose-Capillary: 177 mg/dL — ABNORMAL HIGH (ref 70–99)
Glucose-Capillary: 169 mg/dL — ABNORMAL HIGH (ref 70–99)
Glucose-Capillary: 152 mg/dL — ABNORMAL HIGH (ref 70–99)

## 2019-09-12 LAB — POCT I-STAT 7, (LYTES, BLD GAS, ICA,H+H)
Acid-base deficit: 4 mmol/L — ABNORMAL HIGH (ref 0.0–2.0)
Bicarbonate: 21.8 mmol/L (ref 20.0–28.0)
Calcium, Ion: 1.04 mmol/L — ABNORMAL LOW (ref 1.15–1.40)
HCT: 32 % — ABNORMAL LOW (ref 39.0–52.0)
Hemoglobin: 10.9 g/dL — ABNORMAL LOW (ref 13.0–17.0)
O2 Saturation: 100 %
Patient temperature: 34.4
Potassium: 3 mmol/L — ABNORMAL LOW (ref 3.5–5.1)
Sodium: 141 mmol/L (ref 135–145)
TCO2: 23 mmol/L (ref 22–32)
pCO2 arterial: 36.3 mmHg (ref 32.0–48.0)
pH, Arterial: 7.375 (ref 7.350–7.450)
pO2, Arterial: 484 mmHg — ABNORMAL HIGH (ref 83.0–108.0)

## 2019-09-12 LAB — CBC
HCT: 36.2 % — ABNORMAL LOW (ref 39.0–52.0)
Hemoglobin: 12 g/dL — ABNORMAL LOW (ref 13.0–17.0)
MCH: 30.3 pg (ref 26.0–34.0)
MCHC: 33.1 g/dL (ref 30.0–36.0)
MCV: 91.4 fL (ref 80.0–100.0)
Platelets: 242 10*3/uL (ref 150–400)
RBC: 3.96 MIL/uL — ABNORMAL LOW (ref 4.22–5.81)
RDW: 12.7 % (ref 11.5–15.5)
WBC: 13.3 10*3/uL — ABNORMAL HIGH (ref 4.0–10.5)
nRBC: 0 % (ref 0.0–0.2)

## 2019-09-12 MED ORDER — PHENAZOPYRIDINE HCL 100 MG PO TABS
100.0000 mg | ORAL_TABLET | Freq: Three times a day (TID) | ORAL | Status: DC
  Administered 2019-09-12 – 2019-09-13 (×4): 100 mg via ORAL
  Filled 2019-09-12 (×6): qty 1

## 2019-09-12 MED ORDER — LIVING WELL WITH DIABETES BOOK
Freq: Once | Status: AC
  Filled 2019-09-12: qty 1

## 2019-09-12 NOTE — Progress Notes (Signed)
Inpatient Diabetes Program Recommendations  AACE/ADA: New Consensus Statement on Inpatient Glycemic Control (2015)  Target Ranges:  Prepandial:   less than 140 mg/dL      Peak postprandial:   less than 180 mg/dL (1-2 hours)      Critically ill patients:  140 - 180 mg/dL   Lab Results  Component Value Date   GLUCAP 190 (H) 09/12/2019   HGBA1C 7.9 (H) 09/11/2019    Review of Glycemic Control  Inpatient Diabetes Program Recommendations:   Spoke with patient @ bedside and discussed diabetes management. Patient states he has never been on a long acting insulin but willing to discuss with his NP on next visit. States his fasting CBG starts approximately 150-260. Reviewed that a basal insulin would help his fasting CBG to be lower and CBGs throughout the day would be improved. Patient prefers not to make changes on discharge but rather to followup with NP. Patient states he has resource information @ home and his wife is a Therapist, sports so feels that no referrals @ this time are needed.  Thank you, Nani Gasser. Shantay Sonn, RN, MSN, CDE  Diabetes Coordinator Inpatient Glycemic Control Team Team Pager [phone] (8am-5pm) 09/12/2019 10:19 AM

## 2019-09-12 NOTE — Evaluation (Signed)
Occupational Therapy Evaluation Patient Details Name: Tony Christensen MRN: [id number] DOB: 10-01-1960 Today's Date: 09/12/2019    History of Present Illness 59 yo male s/p revision of pseudoarthrosis L5-S1 with posterior lateral arthrodesis using allograft and infuse and pedicular fixation from L3 to the ileum, Replacement of hardware at S1. PMH includes L3-S1 fusion 2020, bladder and lung cancer, DM, HTN.   Clinical Impression   Pt PTA:  Pt living with spouse and reports independence with ADL and mobility. Pt currently performing ADL with figure four technique with no physical assist and no cues required for abiding by precautions. Pt reports pain with mobility, but prefers not to take pain medicine. Pt ambulating in room with no AD. Pt simulating walk-in shower transfers with no physical assist. Pt very talkative and requiring cues to redirect to OT session. Pt does not require continued OT skilled services. Pt reports AE, DME at home will be sufficient. OT signing off.    Follow Up Recommendations  No OT follow up;Supervision - Intermittent    Equipment Recommendations  None recommended by OT    Recommendations for Other Services       Precautions / Restrictions Precautions Precautions: Fall;Back Precaution Booklet Issued: Yes (comment) Precaution Comments: Able to recall 3/3 precautions. Required Braces or Orthoses: Spinal Brace Spinal Brace: Other (comment)(left at home; daughter bringing at some point today) Restrictions Weight Bearing Restrictions: No      Mobility Bed Mobility Overal bed mobility: Needs Assistance Bed Mobility: Sidelying to Sit Rolling: Supervision Sidelying to sit: Supervision       General bed mobility comments: no physical assist; pt automatically performing log roll  Transfers Overall transfer level: Needs assistance Equipment used: None Transfers: Sit to/from Stand Sit to Stand: Supervision         General transfer comment: no physical  assist     Balance Overall balance assessment: Needs assistance;History of Falls Sitting-balance support: Feet supported;No upper extremity supported Sitting balance-Leahy Scale: Good     Standing balance support: No upper extremity supported;During functional activity Standing balance-Leahy Scale: Fair                             ADL either performed or assessed with clinical judgement   ADL Overall ADL's : At baseline;Modified independent                                       General ADL Comments: Pt performing figure 4 technique for LB ADL tasks. Pt able to state precautions and abide by them at this time. Pt simulating step-in shower transfers. Pt with no difficulty with ADL dressing routine at this time with no cues for proper technique.     Vision Baseline Vision/History: No visual deficits Patient Visual Report: No change from baseline Vision Assessment?: No apparent visual deficits     Perception     Praxis      Pertinent Vitals/Pain Pain Assessment: Faces Faces Pain Scale: Hurts little more Pain Location: back Pain Descriptors / Indicators: Sore;Discomfort Pain Intervention(s): Monitored during session;Repositioned     Hand Dominance Right   Extremity/Trunk Assessment Upper Extremity Assessment Upper Extremity Assessment: Overall WFL for tasks assessed   Lower Extremity Assessment Lower Extremity Assessment: Overall WFL for tasks assessed;Defer to PT evaluation   Cervical / Trunk Assessment Cervical / Trunk Assessment: Other exceptions Cervical / Trunk Exceptions: s/p  lumbar fusion revision   Communication Communication Communication: No difficulties   Cognition Arousal/Alertness: Awake/alert Behavior During Therapy: WFL for tasks assessed/performed Overall Cognitive Status: Within Functional Limits for tasks assessed                                 General Comments: Pt very talkative and tangential,  difficult to redirect at times. Jokes a lot.   General Comments  Pt reports brace is at home. Pt has been through back surgery     Exercises     Shoulder Instructions      Home Living Family/patient expects to be discharged to:: Private residence Living Arrangements: Spouse/significant other Available Help at Discharge: Family;Available PRN/intermittently Type of Home: House Home Access: Level entry     Home Layout: One level;Laundry or work area in basement     [employer]: Occupational psychologist: Standard Bathroom Accessibility: Yes   Home Equipment: Environmental consultant - 2 wheels          Prior Functioning/Environment Level of Independence: Independent with assistive device(s)        Comments: pt reports using RW intermittently PTA, states he has had 1 fall in the past year. Worked at [employer] until 1995, reports "they forced me to retire".        OT Problem List: Decreased activity tolerance      OT Treatment/Interventions:      OT Goals(Current goals can be found in the care plan section) Acute Rehab OT Goals Patient Stated Goal: go home OT Goal Formulation: With patient Time For Goal Achievement: 09/26/19 Potential to Achieve Goals: Good  OT Frequency:     Barriers to D/C:            Co-evaluation              AM-PAC OT "6 Clicks" Daily Activity     Outcome Measure Help from another person eating meals?: None Help from another person taking care of personal grooming?: None Help from another person toileting, which includes using toliet, bedpan, or urinal?: None Help from another person bathing (including washing, rinsing, drying)?: A Little Help from another person to put on and taking off regular upper body clothing?: None Help from another person to put on and taking off regular lower body clothing?: None 6 Click Score: 23   End of Session Equipment Utilized During Treatment: Gait belt Nurse Communication: Mobility  status  Activity Tolerance: Patient tolerated treatment well Patient left: in chair;with call bell/phone within reach  OT Visit Diagnosis: Unsteadiness on feet (R26.81);Muscle weakness (generalized) (M62.81)                Time: [id number] OT Time Calculation (min): 42 min Charges:  OT General Charges $OT Visit: 1 Visit OT Evaluation $OT Eval Moderate Complexity: 1 Mod OT Treatments $Self Care/Home Management : 8-22 mins $Therapeutic Activity: 8-22 mins  Tony Christensen, OTR/L Acute Rehabilitation Services Pager: [phone] Office: [phone]   Olan Kurek C 09/12/2019, 2:29 PM

## 2019-09-12 NOTE — Progress Notes (Signed)
Patient ID: Tony Christensen, male   DOB: 1961/02/14, 59 y.o.   MRN: [id number] Vital signs are stable Motor function is intact Incision is clean and dry Patient is complaining of significant soreness in the back His radicular pain is better Labs postoperatively shows creatinine is 1.22 Sugars are up but are coming under control Clinically is doing well We will plan discharge for tomorrow

## 2019-09-12 NOTE — Progress Notes (Signed)
Physical Therapy Treatment Patient Details Name: Tony Christensen MRN: [id number] DOB: February 09, 1961 Today's Date: 09/12/2019    History of Present Illness 59 yo male s/p revision of pseudoarthrosis L5-S1 with posterior lateral arthrodesis using allograft and infuse and pedicular fixation from L3 to the ileum, Replacement of hardware at S1. PMH includes L3-S1 fusion 2020, bladder and lung cancer, DM, HTN.    PT Comments    Patient progressing well towards PT goals. Reports worsening soreness this morning. Improved ambulation distance without need for DME or UE support with supervision for safety. Some mild drifting noted but no overt LOB. Able to recall 3/3 back precautions. Forgot brace at home. Reviewed log roll technique to simulate home without rails and HOB flat, walking program, positioning and brace etc. Encouraged walking with nursing daily. Will follow if still in hospital.    Follow Up Recommendations  No PT follow up;Supervision - Intermittent     Equipment Recommendations  None recommended by PT    Recommendations for Other Services       Precautions / Restrictions Precautions Precautions: Fall;Back Precaution Booklet Issued: Yes (comment) Precaution Comments: Able to recall 3/3 precautions. Required Braces or Orthoses: Spinal Brace Spinal Brace: Lumbar corset;Applied in sitting position(left brace at home) Restrictions Weight Bearing Restrictions: No    Mobility  Bed Mobility Overal bed mobility: Needs Assistance Bed Mobility: Rolling;Sidelying to Sit Rolling: Supervision Sidelying to sit: Supervision       General bed mobility comments: HOB flat without rails to simulate home, good demo of log roll technique. increased effort.  Transfers Overall transfer level: Needs assistance Equipment used: None Transfers: Sit to/from Stand Sit to Stand: Supervision         General transfer comment: Supervision for safety. Stood from EOB slowly with cues to refrain  from bending at back.  Ambulation/Gait Ambulation/Gait assistance: Supervision Gait Distance (Feet): 400 Feet Assistive device: None Gait Pattern/deviations: Step-through pattern;Decreased stride length;Drifts right/left Gait velocity: decr   General Gait Details: Slow, steady gait without UE support today. Occasional drifting but no overt LOB. A few standing rest breaks.   Stairs             Wheelchair Mobility    Modified Rankin (Stroke Patients Only)       Balance Overall balance assessment: Needs assistance;History of Falls Sitting-balance support: Feet supported;No upper extremity supported Sitting balance-Leahy Scale: Good     Standing balance support: During functional activity Standing balance-Leahy Scale: Fair                              Cognition Arousal/Alertness: Awake/alert Behavior During Therapy: WFL for tasks assessed/performed Overall Cognitive Status: Within Functional Limits for tasks assessed                                 General Comments: Pt very talkative and tangential, difficult to redirect at times. Jokes a lot.      Exercises      General Comments General comments (skin integrity, edema, etc.): Incision-clean, dry and intact. Reports his brace is at home.      Pertinent Vitals/Pain Pain Assessment: Faces Faces Pain Scale: Hurts even more Pain Location: back Pain Descriptors / Indicators: Sore;Discomfort Pain Intervention(s): Repositioned;Monitored during session    Home Living  Prior Function            PT Goals (current goals can now be found in the care plan section) Progress towards PT goals: Progressing toward goals    Frequency    Min 5X/week      PT Plan Current plan remains appropriate    Co-evaluation              AM-PAC PT "6 Clicks" Mobility   Outcome Measure  Help needed turning from your back to your side while in a flat bed without  using bedrails?: None Help needed moving from lying on your back to sitting on the side of a flat bed without using bedrails?: None Help needed moving to and from a bed to a chair (including a wheelchair)?: A Little Help needed standing up from a chair using your arms (e.g., wheelchair or bedside chair)?: A Little Help needed to walk in hospital room?: A Little Help needed climbing 3-5 steps with a railing? : A Little 6 Click Score: 20    End of Session Equipment Utilized During Treatment: Gait belt Activity Tolerance: Patient tolerated treatment well Patient left: in bed;with call bell/phone within reach(sitting EOB) Nurse Communication: Mobility status PT Visit Diagnosis: Muscle weakness (generalized) (M62.81);Difficulty in walking, not elsewhere classified (R26.2);Pain Pain - part of body: (back)     Time: [id number] PT Time Calculation (min) (ACUTE ONLY): 19 min  Charges:  $Gait Training: 8-22 mins                     Marisa Severin, PT, DPT Acute Rehabilitation Services Pager [phone] Office [phone]       [place] 09/12/2019, 11:36 AM

## 2019-09-13 LAB — GLUCOSE, CAPILLARY
Glucose-Capillary: 186 mg/dL — ABNORMAL HIGH (ref 70–99)
Glucose-Capillary: 202 mg/dL — ABNORMAL HIGH (ref 70–99)

## 2019-09-13 LAB — SURGICAL PATHOLOGY

## 2019-09-13 MED ORDER — CIPROFLOXACIN HCL 500 MG PO TABS
500.0000 mg | ORAL_TABLET | Freq: Two times a day (BID) | ORAL | Status: DC
  Administered 2019-09-13: 500 mg via ORAL
  Filled 2019-09-13 (×2): qty 1

## 2019-09-13 MED ORDER — CIPROFLOXACIN HCL 500 MG PO TABS: 500.0000 mg | ORAL_TABLET | Freq: Two times a day (BID) | ORAL | 0 refills | Status: AC

## 2019-09-13 MED ORDER — OXYCODONE-ACETAMINOPHEN 5-325 MG PO TABS: 1.0000 | ORAL_TABLET | ORAL | 0 refills | Status: DC | PRN

## 2019-09-13 MED ORDER — METHOCARBAMOL 500 MG PO TABS: 500.0000 mg | ORAL_TABLET | Freq: Four times a day (QID) | ORAL | 3 refills | Status: DC | PRN

## 2019-09-13 MED FILL — Sodium Chloride Irrigation Soln 0.9%: Qty: 3000 | Status: AC

## 2019-09-13 MED FILL — Sodium Chloride IV Soln 0.9%: INTRAVENOUS | Qty: 1000 | Status: AC

## 2019-09-13 MED FILL — Heparin Sodium (Porcine) Inj 1000 Unit/ML: INTRAMUSCULAR | Qty: 30 | Status: AC

## 2019-09-13 NOTE — Plan of Care (Signed)
Patient alert and oriented, mae's well, voiding adequate amount of urine, swallowing without difficulty, c/o pain at time of discharge and medication given prior to discharged. Patient discharged home with family. Script and discharged instructions given to patient. Patient and family stated understanding of instructions given. Patient has an appointment with Dr. Ellene Route

## 2019-09-13 NOTE — Discharge Instructions (Signed)
Wound Care Leave incision open to air. You may shower. Do not scrub directly on incision.  Do not put any creams, lotions, or ointments on incision. Activity Walk each and every day, increasing distance each day. No lifting greater than 5 lbs.  Avoid bending, arching, and twisting. No driving for 2 weeks; may ride as a passenger locally. If provided with back brace, wear when out of bed.  It is not necessary to wear in bed. Diet Resume your normal diet.  Return to Work Will be discussed at you follow up appointment. Call Your Doctor If Any of These Occur Redness, drainage, or swelling at the wound.  Temperature greater than 101 degrees. Severe pain not relieved by pain medication. Incision starts to come apart. Follow Up Appt Call today for appointment in 2 weeks ([phone]) or for problems.  If you have any hardware placed in your spine, you will need an x-ray before your appointment. 

## 2019-09-13 NOTE — Discharge Summary (Signed)
Physician Discharge Summary  Patient ID: Tony Christensen MRN: [id number] DOB/AGE: 20-May-1960 59 y.o.  Admit date: 09/11/2019 Discharge date: 09/13/2019  Admission Diagnoses: Pseudoarthrosis L5-S1 with lumbar radiculopathy status post L3 to sacrum fusion, diabetes mellitus  Discharge Diagnoses: Pseudoarthrosis L5-S1 with lumbar radiculopathy status post L3 to sacrum fusion, diabetes mellitus Active Problems:   Pseudoarthrosis of lumbar spine   Discharged Condition: good  Hospital Course: Patient was admitted to undergo surgical revision of posterior fusion with fixation to the ileum.  He tolerated the surgery well.  Blood sugars have been under moderate control.  Consults: Diabetic coordinator  Significant Diagnostic Studies: None  Treatments: Surgical revision of pseudoarthrosis L5-S1 with fixation to the ileum  Discharge Exam: Blood pressure (!) 147/83, pulse 92, temperature 98.2 F (36.8 C), temperature source Oral, resp. rate 17, height 5\' 8"  (1.727 m), weight 98.9 kg, SpO2 100 %. Incision is clean and dry motor function is intact.  Incision  Disposition: Discharge disposition: 01-Home or Self Care       Discharge Instructions    Call MD for:  redness, tenderness, or signs of infection (pain, swelling, redness, odor or green/yellow discharge around incision site)   Complete by: As directed    Call MD for:  severe uncontrolled pain   Complete by: As directed    Call MD for:  temperature >100.4   Complete by: As directed    Diet - low sodium heart healthy   Complete by: As directed    Discharge instructions   Complete by: As directed    Okay to shower. Do not apply salves or appointments to incision. No heavy lifting with the upper extremities greater than 15 pounds. May resume driving when not requiring pain medication and patient feels comfortable with doing so.   Incentive spirometry RT   Complete by: As directed    Increase activity slowly   Complete by: As  directed      Allergies as of 09/13/2019      Reactions   Ceclor [cefaclor] Anaphylaxis      Medication List    TAKE these medications   atorvastatin 40 MG tablet Commonly known as: LIPITOR Take 40 mg by mouth at bedtime.   benazepril 10 MG tablet Commonly known as: LOTENSIN Take 10 mg by mouth daily.   ciprofloxacin 500 MG tablet Commonly known as: CIPRO Take 1 tablet (500 mg total) by mouth 2 (two) times daily.   cyclobenzaprine 10 MG tablet Commonly known as: FLEXERIL Take 10 mg by mouth 3 (three) times daily as needed for muscle spasms.   diphenhydrAMINE 25 mg capsule Commonly known as: BENADRYL Take 75 mg by mouth daily.   fluticasone 50 MCG/ACT nasal spray Commonly known as: FLONASE Place 1 spray into both nostrils daily as needed for allergies.   GOODY HEADACHE PO Take 1 Package by mouth daily as needed (pain).   HONEY PO Take 15 mLs by mouth daily.   HYDROcodone-acetaminophen 10-325 MG tablet Commonly known as: NORCO Take 1 tablet by mouth every 6 (six) hours as needed (pain).   levothyroxine 75 MCG tablet Commonly known as: SYNTHROID Take 75 mcg by mouth daily before breakfast.   magnesium chloride 64 MG Tbec SR tablet Commonly known as: SLOW-MAG Take 1 tablet (64 mg total) by mouth daily.   meloxicam 7.5 MG tablet Commonly known as: MOBIC Take 7.5 mg by mouth 2 (two) times daily.   metFORMIN 500 MG tablet Commonly known as: GLUCOPHAGE Take 500 mg by mouth 2 (two)  times daily with a meal.   methocarbamol 500 MG tablet Commonly known as: ROBAXIN Take 1 tablet (500 mg total) by mouth every 6 (six) hours as needed for muscle spasms.   metoprolol tartrate 50 MG tablet Commonly known as: LOPRESSOR Take 50 mg by mouth daily.   multivitamin with minerals tablet Take 1 tablet by mouth daily.   nortriptyline 10 MG capsule Commonly known as: PAMELOR Take 10 mg by mouth at bedtime.   NovoLOG FlexPen 100 UNIT/ML FlexPen Generic drug: insulin  aspart Inject 8-22 Units into the skin 3 (three) times daily with meals. Takes with meals as needed   nystatin 100000 UNIT/ML suspension Commonly known as: MYCOSTATIN Use as directed 5 mLs in the mouth or throat daily as needed (Mouth pain).   omeprazole 20 MG capsule Commonly known as: PRILOSEC Take 20 mg by mouth every morning.   oxyCODONE-acetaminophen 5-325 MG tablet Commonly known as: PERCOCET/ROXICET Take 1-2 tablets by mouth every 3 (three) hours as needed for moderate pain or severe pain.   pregabalin 200 MG capsule Commonly known as: LYRICA Take 200 mg by mouth 2 (two) times daily.   tamsulosin 0.4 MG Caps capsule Commonly known as: FLOMAX Take 0.4 mg by mouth every evening.   vitamin C with rose hips 1000 MG tablet Take 2,000 mg by mouth daily.   Vitamin D-3 125 MCG (5000 UT) Tabs Take 5,000 Units by mouth daily.   zinc gluconate 50 MG tablet Take 100 mg by mouth daily.        Signed: Blanchie Dessert Candace Ramus 09/13/2019, 9:07 AM

## 2019-09-13 NOTE — Progress Notes (Signed)
Physical Therapy Treatment Patient Details Name: Tony Christensen MRN: [id number] DOB: [birth year] Today's Date: 09/13/2019    History of Present Illness 59 yo male s/p revision of pseudoarthrosis L5-S1 with posterior lateral arthrodesis using allograft and infuse and pedicular fixation from L3 to the ileum, Replacement of hardware at S1. PMH includes L3-S1 fusion 2020, bladder and lung cancer, DM, HTN.    PT Comments    Pt progressing towards goals, increased stability and fluidity with ambulation, and able to navigate stairs without physical assist. Pt with good home set up and support. Acute PT to cont to follow.  Follow Up Recommendations  No PT follow up;Supervision - Intermittent     Equipment Recommendations  None recommended by PT    Recommendations for Other Services       Precautions / Restrictions Precautions Precautions: Fall;Back Precaution Booklet Issued: Yes (comment) Precaution Comments: Able to recall 3/3 precautions. Required Braces or Orthoses: Spinal Brace Spinal Brace: Lumbar corset Spinal Brace Comments: pt left in the car Restrictions Weight Bearing Restrictions: No    Mobility  Bed Mobility Overal bed mobility: Needs Assistance Bed Mobility: Rolling;Sidelying to Sit Rolling: Supervision Sidelying to sit: Supervision       General bed mobility comments: increased time, no bed rail, max verbal cues to stay on task as pt easily distracted  Transfers Overall transfer level: Needs assistance Equipment used: None Transfers: Sit to/from Stand Sit to Stand: Supervision         General transfer comment: no physical assist   Ambulation/Gait Ambulation/Gait assistance: Supervision Gait Distance (Feet): 350 Feet Assistive device: None Gait Pattern/deviations: Step-through pattern;Decreased stride length;Drifts right/left Gait velocity: decr Gait velocity interpretation: 1.31 - 2.62 ft/sec, indicative of limited community ambulator General Gait  Details: slow, steady, no episode of LOB, pt with large abdomen causing pt to rest in anterior pelvic tilt   Stairs Stairs: Yes Stairs assistance: Min guard Stair Management: One rail Left;Alternating pattern;Forwards Number of Stairs: 10 General stair comments: no physical assist needed   Wheelchair Mobility    Modified Rankin (Stroke Patients Only)       Balance Overall balance assessment: Needs assistance;History of Falls Sitting-balance support: Feet supported;No upper extremity supported Sitting balance-Leahy Scale: Good     Standing balance support: No upper extremity supported;During functional activity Standing balance-Leahy Scale: Fair                              Cognition Arousal/Alertness: Awake/alert Behavior During Therapy: WFL for tasks assessed/performed Overall Cognitive Status: Within Functional Limits for tasks assessed                                 General Comments: Pt very talkative and tangential, difficult to redirect at times. Jokes a lot.      Exercises      General Comments General comments (skin integrity, edema, etc.): VSS, incision unobserved      Pertinent Vitals/Pain Pain Assessment: Faces Pain Score: 4  Pain Location: back Pain Descriptors / Indicators: Sore;Discomfort Pain Intervention(s): Monitored during session    Home Living                      Prior Function            PT Goals (current goals can now be found in the care plan section) Acute Rehab PT Goals Patient Stated  Goal: go home Progress towards PT goals: Progressing toward goals    Frequency    Min 5X/week      PT Plan Current plan remains appropriate    Co-evaluation              AM-PAC PT "6 Clicks" Mobility   Outcome Measure  Help needed turning from your back to your side while in a flat bed without using bedrails?: None Help needed moving from lying on your back to sitting on the side of a flat bed  without using bedrails?: None Help needed moving to and from a bed to a chair (including a wheelchair)?: A Little Help needed standing up from a chair using your arms (e.g., wheelchair or bedside chair)?: A Little Help needed to walk in hospital room?: A Little Help needed climbing 3-5 steps with a railing? : A Little 6 Click Score: 20    End of Session Equipment Utilized During Treatment: Gait belt Activity Tolerance: Patient tolerated treatment well Patient left: in bed;with call bell/phone within reach(sitting EOB) Nurse Communication: Mobility status PT Visit Diagnosis: Muscle weakness (generalized) (M62.81);Difficulty in walking, not elsewhere classified (R26.2);Pain Pain - part of body: (back)     Time: 0937-1000 PT Time Calculation (min) (ACUTE ONLY): 23 min  Charges:  $Gait Training: 8-22 mins $Therapeutic Activity: 8-22 mins                     Kittie Plater, PT, DPT Acute Rehabilitation Services Pager #: [phone] Office #: [phone]    [place] Lopes 09/13/2019, 10:50 AM

## 2019-11-02 ENCOUNTER — Other Ambulatory Visit (HOSPITAL_COMMUNITY): Payer: Self-pay | Admitting: Neurological Surgery

## 2019-11-02 ENCOUNTER — Other Ambulatory Visit: Payer: Self-pay | Admitting: Neurological Surgery

## 2019-11-02 DIAGNOSIS — S32009K Unspecified fracture of unspecified lumbar vertebra, subsequent encounter for fracture with nonunion: Secondary | ICD-10-CM

## 2019-11-30 ENCOUNTER — Ambulatory Visit (HOSPITAL_COMMUNITY)
Admission: RE | Admit: 2019-11-30 | Discharge: 2019-11-30 | Disposition: A | Discharge status: Home / Self Care | Payer: Medicare PPO | Source: Ambulatory Visit | Attending: Neurological Surgery | Admitting: Neurological Surgery

## 2019-11-30 ENCOUNTER — Other Ambulatory Visit: Payer: Self-pay

## 2019-11-30 DIAGNOSIS — S32009K Unspecified fracture of unspecified lumbar vertebra, subsequent encounter for fracture with nonunion: Secondary | ICD-10-CM | POA: Insufficient documentation

## 2020-03-15 ENCOUNTER — Encounter (HOSPITAL_COMMUNITY): Payer: Self-pay

## 2020-03-15 ENCOUNTER — Other Ambulatory Visit: Payer: Self-pay

## 2020-03-15 ENCOUNTER — Emergency Department (HOSPITAL_COMMUNITY): Payer: Medicare PPO

## 2020-03-15 ENCOUNTER — Emergency Department (HOSPITAL_COMMUNITY)
Admission: EM | Admit: 2020-03-15 | Discharge: 2020-03-15 | Disposition: A | Discharge status: Home / Self Care | Payer: Medicare PPO | Attending: Emergency Medicine | Admitting: Emergency Medicine

## 2020-03-15 DIAGNOSIS — Z794 Long term (current) use of insulin: Secondary | ICD-10-CM | POA: Insufficient documentation

## 2020-03-15 DIAGNOSIS — Z7982 Long term (current) use of aspirin: Secondary | ICD-10-CM | POA: Insufficient documentation

## 2020-03-15 DIAGNOSIS — Z79899 Other long term (current) drug therapy: Secondary | ICD-10-CM | POA: Insufficient documentation

## 2020-03-15 DIAGNOSIS — M549 Dorsalgia, unspecified: Secondary | ICD-10-CM

## 2020-03-15 DIAGNOSIS — Z9889 Other specified postprocedural states: Secondary | ICD-10-CM

## 2020-03-15 DIAGNOSIS — M545 Low back pain, unspecified: Secondary | ICD-10-CM | POA: Diagnosis not present

## 2020-03-15 DIAGNOSIS — E039 Hypothyroidism, unspecified: Secondary | ICD-10-CM | POA: Diagnosis not present

## 2020-03-15 DIAGNOSIS — I10 Essential (primary) hypertension: Secondary | ICD-10-CM | POA: Diagnosis not present

## 2020-03-15 DIAGNOSIS — E119 Type 2 diabetes mellitus without complications: Secondary | ICD-10-CM | POA: Insufficient documentation

## 2020-03-15 DIAGNOSIS — Z7984 Long term (current) use of oral hypoglycemic drugs: Secondary | ICD-10-CM | POA: Insufficient documentation

## 2020-03-15 DIAGNOSIS — Z8551 Personal history of malignant neoplasm of bladder: Secondary | ICD-10-CM | POA: Diagnosis not present

## 2020-03-15 MED ORDER — METHOCARBAMOL 1000 MG/10ML IJ SOLN
INTRAMUSCULAR | Status: AC
  Filled 2020-03-15: qty 10

## 2020-03-15 MED ORDER — KETOROLAC TROMETHAMINE 30 MG/ML IJ SOLN
15.0000 mg | Freq: Once | INTRAMUSCULAR | Status: AC
  Administered 2020-03-15: 15 mg via INTRAVENOUS
  Filled 2020-03-15: qty 1

## 2020-03-15 MED ORDER — BACLOFEN 10 MG PO TABS: 10.0000 mg | ORAL_TABLET | Freq: Three times a day (TID) | ORAL | 0 refills | Status: AC

## 2020-03-15 MED ORDER — LIDOCAINE 5 % EX PTCH: 1.0000 | MEDICATED_PATCH | CUTANEOUS | 0 refills | Status: AC

## 2020-03-15 MED ORDER — LIDOCAINE 5 % EX PTCH
1.0000 | MEDICATED_PATCH | Freq: Once | CUTANEOUS | Status: DC
  Administered 2020-03-15: 1 via TRANSDERMAL
  Filled 2020-03-15: qty 1

## 2020-03-15 MED ORDER — HYDROMORPHONE HCL 1 MG/ML IJ SOLN
1.0000 mg | Freq: Once | INTRAMUSCULAR | Status: AC
  Administered 2020-03-15: 1 mg via INTRAVENOUS
  Filled 2020-03-15: qty 1

## 2020-03-15 MED ORDER — OXYCODONE-ACETAMINOPHEN 5-325 MG PO TABS
1.0000 | ORAL_TABLET | ORAL | 0 refills | Status: AC | PRN
Start: 2020-03-15 — End: ?

## 2020-03-15 MED ORDER — METHOCARBAMOL 1000 MG/10ML IJ SOLN
1000.0000 mg | Freq: Once | INTRAVENOUS | Status: AC
  Administered 2020-03-15: 1000 mg via INTRAVENOUS
  Filled 2020-03-15: qty 10

## 2020-03-15 NOTE — ED Notes (Signed)
Ice pack given per pt request.

## 2020-03-15 NOTE — ED Provider Notes (Signed)
   Patient signed out to me by Benedetto Goad, PA-C pending completion of work-up.  Patient with history of chronic back pain with multiple prior back surgeries, here with worsening midline and left low back pain, no new neurologic deficits. Pain was addressed here and CT of the L-spine was ordered.  Patient reports feeling better after medications given here.  CT results reviewed by me, showing no acute findings. Will try changing his pain medication and muscle relaxer along with prescription for lidocaine patches.   patient is appropriate for outpatient follow-up with his neurosurgeon, Dr. Ellene Route.  The patient appears reasonably screened and/or stabilized for discharge and I doubt any other medical condition or other [facility] requiring further screening, evaluation, or treatment in the ED at this time prior to discharge.    CT Lumbar Spine Wo Contrast  Result Date: 03/15/2020 CLINICAL DATA:  Back pain EXAM: CT LUMBAR SPINE WITHOUT CONTRAST TECHNIQUE: Multidetector CT imaging of the lumbar spine was performed without intravenous contrast administration. Multiplanar CT image reconstructions were also generated. COMPARISON:  CT lumbar spine dated July 19, 2019.  November 30, 2019 FINDINGS: Segmentation: 5 lumbar type vertebrae. Alignment: Normal. Vertebrae: There is no evidence for an acute compression fracture. Posterior fusion hardware is again noted spanning from L3 through S1 with iliac extensions. The hardware appears intact. Multilevel interbody spaces are noted which are stable. Paraspinal and other soft tissues: Atherosclerotic changes are noted of the abdominal aorta. Disc levels: Disc height loss is noted at the L1-L2 and L2-L3 levels, similar to before. IMPRESSION: No acute osseous abnormality.  Chronic findings as detailed above. Electronically Signed   By: Constance Holster M.D.   On: 03/15/2020 19:03      Kem Parkinson, PA-C 03/15/20 2049    Carmin Muskrat, MD 03/15/20 [id number]

## 2020-03-15 NOTE — ED Triage Notes (Signed)
Pt presents to ED with chronic lower back started getting worse the last couple of weeks.

## 2020-03-15 NOTE — ED Notes (Signed)
Pt in bed, wife at bedside, pt reports a decrease in pain, reminded pt that his lido patch stays on for 12 hrs then off for 12 hrs, pt verbalized understanding.

## 2020-03-15 NOTE — ED Provider Notes (Signed)
[facility] EMERGENCY DEPARTMENT Provider Note   CSN: [id number] Arrival date & time: 03/15/20  1644     History Chief Complaint  Patient presents with  . Back Pain    Tony Christensen is a 59 y.o. male.  Tony Christensen is a 59 y.o. male with a history of chronic back pain s/p multiple surgeries, bladder cancer, hypertension, GERD, kidney stones, diabetes, who presents to the emergency department for worsening of his chronic low back pain.  He is followed by Dr. Ellene Route with neurosurgery and has had multiple surgeries on his lumbar spine, most recently in April of this year.  He states that ever since August he has been having a worsening of his low back pain, primarily on the left side of his back just above his sacrum.  He states that intermittently he will move a certain way and have severe sharp sudden pains.  He reports that over the past week or 2 the pain has gotten significantly worse and it is now affecting his ability to get up, walk and get out of the house.  He has not seen his neurosurgeon regarding this worsening pain yet, had an appointment to follow-up with him on Wednesday of this week but missed this unfortunately due to to mixing up the days in his calendar.  He states that he chronically has some numbness and neuropathy in his right leg and some tingling in the left thigh, but denies any new focal numbness or weakness.  Denies any new loss of bowel or bladder control.  States that he has been treating pain with hydrocodone and Lyrica, but reports it is no longer helping with pain.  Patient is very frustrated.  He also repeatedly states that he is very worried, does not like taking pain medication and is not just here looking for pain medication.  He is very concerned given his numerous back surgeries that have required revisions previously but something may be broken or out of place.        Past Medical History:  Diagnosis Date  . Arthritis   . Cancer ([place])     Bladder/lung ( node on right lung-watching)  . Diabetes mellitus without complication ([place])   . GERD (gastroesophageal reflux disease)   . Heart murmur   . History of kidney stones   . Hypertension   . Hypothyroidism   . Kidney cysts   . Kidney stones   . Sleep apnea   . Thyroid disease     Patient Active Problem List   Diagnosis Date Noted  . Pseudoarthrosis of lumbar spine 09/11/2019  . Lumbar stenosis with neurogenic claudication 11/07/2018    Past Surgical History:  Procedure Laterality Date  . ABDOMINAL EXPOSURE N/A 11/07/2018   Procedure: ABDOMINAL EXPOSURE;  Surgeon: Rosetta Posner, MD;  Location: [facility] OR;  Service: Vascular;  Laterality: N/A;  . ANTERIOR LATERAL LUMBAR FUSION WITH PERCUTANEOUS SCREW 3 LEVEL N/A 11/07/2018   Procedure: ANTERIOR LATERAL LUMBAR FUSION LUMBAR THREE-FOUR;  Surgeon: Kristeen Miss, MD;  Location: [place];  Service: Neurosurgery;  Laterality: N/A;  Lumbar 3-4 Anterolateral lumbar interbody fusion with Lumbar 3 to Sacral 1 Posterior fixation with pedicle screws  . ANTERIOR LUMBAR FUSION N/A 11/07/2018   Procedure: LUMBAR FOUR-FIVE, LUMBAR FIVE-SACRAL ONE ANTERIOR LUMBAR INTERBODY FUSION;  Surgeon: Kristeen Miss, MD;  Location: [place];  Service: Neurosurgery;  Laterality: N/A;  Lumbar 4-5 Lumbar 5 Sacral 1 Anterior lumbar interbody fusion  . APPLICATION OF ROBOTIC ASSISTANCE FOR SPINAL PROCEDURE  N/A 11/07/2018   Procedure: PLACEMENT OF PERCUTANEOUS PEDICLE SCREWS LUMBAR THREE-SACRAL ONE WITH APPLICATION OF ROBOTIC ASSISTANCE;  Surgeon: Kristeen Miss, MD;  Location: [place];  Service: Neurosurgery;  Laterality: N/A;  . BACK SURGERY    . BLADDER TUMOR EXCISION    . CARPAL TUNNEL WITH CUBITAL TUNNEL Bilateral   . ELBOW SURGERY Bilateral [id number]  . KNEE ARTHROSCOPY W/ MENISCAL REPAIR Right   . NECK SURGERY         Family History  Problem Relation Age of Onset  . Fibromyalgia Mother   . Arthritis Mother   . Brain cancer Father   . Lung cancer Father   .  Hypertension Father   . Heart disease Father     Social History   Tobacco Use  . Smoking status: Former Smoker    Quit date: 11/01/1989    Years since quitting: 30.3  . Smokeless tobacco: Never Used  Vaping Use  . Vaping Use: Never used  Substance Use Topics  . Alcohol use: Yes    Comment: socially  . Drug use: Never    Home Medications Prior to Admission medications   Medication Sig Start Date End Date Taking? Authorizing Provider  Ascorbic Acid (VITAMIN C WITH ROSE HIPS) 1000 MG tablet Take 2,000 mg by mouth daily.    [provider]  Aspirin-Acetaminophen-Caffeine (GOODY HEADACHE PO) Take 1 Package by mouth daily as needed (pain).    [provider]  atorvastatin (LIPITOR) 40 MG tablet Take 40 mg by mouth at bedtime.     [provider]  benazepril (LOTENSIN) 10 MG tablet Take 10 mg by mouth daily.     [provider]  Cholecalciferol (VITAMIN D-3) 5000 units TABS Take 5,000 Units by mouth daily.     [provider]  ciprofloxacin (CIPRO) 500 MG tablet Take 1 tablet (500 mg total) by mouth 2 (two) times daily. 09/13/19   Kristeen Miss, MD  cyclobenzaprine (FLEXERIL) 10 MG tablet Take 10 mg by mouth 3 (three) times daily as needed for muscle spasms.  07/04/18   [provider]  diphenhydrAMINE (BENADRYL) 25 mg capsule Take 75 mg by mouth daily.     [provider]  fluticasone (FLONASE) 50 MCG/ACT nasal spray Place 1 spray into both nostrils daily as needed for allergies.  06/19/18   [provider]  HONEY PO Take 15 mLs by mouth daily.     [provider]  HYDROcodone-acetaminophen (NORCO) 10-325 MG tablet Take 1 tablet by mouth every 6 (six) hours as needed (pain).    [provider]  levothyroxine (SYNTHROID, LEVOTHROID) 75 MCG tablet Take 75 mcg by mouth daily before breakfast.  08/12/18   [provider]  magnesium chloride (SLOW-MAG) 64 MG TBEC SR tablet Take 1 tablet (64 mg total)  by mouth daily. 11/12/18   Judith Part, MD  meloxicam (MOBIC) 7.5 MG tablet Take 7.5 mg by mouth 2 (two) times daily.  07/07/18   [provider]  metFORMIN (GLUCOPHAGE) 500 MG tablet Take 500 mg by mouth 2 (two) times daily with a meal.    [provider]  methocarbamol (ROBAXIN) 500 MG tablet Take 1 tablet (500 mg total) by mouth every 6 (six) hours as needed for muscle spasms. 09/13/19   Kristeen Miss, MD  metoprolol tartrate (LOPRESSOR) 50 MG tablet Take 50 mg by mouth daily. 03/23/19   [provider]  Multiple Vitamins-Minerals (MULTIVITAMIN WITH MINERALS) tablet Take 1 tablet by mouth daily.  [provider]  nortriptyline (PAMELOR) 10 MG capsule Take 10 mg by mouth at bedtime.    [provider]  NOVOLOG FLEXPEN 100 UNIT/ML FlexPen Inject 8-22 Units into the skin 3 (three) times daily with meals. Takes with meals as needed 06/29/18   [provider]  nystatin (MYCOSTATIN) 100000 UNIT/ML suspension Use as directed 5 mLs in the mouth or throat daily as needed (Mouth pain).  03/23/19   [provider]  omeprazole (PRILOSEC) 20 MG capsule Take 20 mg by mouth every morning.    [provider]  oxyCODONE-acetaminophen (PERCOCET/ROXICET) 5-325 MG tablet Take 1-2 tablets by mouth every 3 (three) hours as needed for moderate pain or severe pain. 09/13/19   Kristeen Miss, MD  pregabalin (LYRICA) 200 MG capsule Take 200 mg by mouth 2 (two) times daily.  07/17/19   [provider]  tamsulosin (FLOMAX) 0.4 MG CAPS capsule Take 0.4 mg by mouth every evening.    [provider]  zinc gluconate 50 MG tablet Take 100 mg by mouth daily.    [provider]    Allergies    Ceclor [cefaclor]  Review of Systems   Review of Systems  Constitutional: Negative for chills and fever.  HENT: Negative.   Respiratory: Negative for shortness of breath.   Cardiovascular: Negative for chest pain.  Gastrointestinal:  Negative for abdominal pain, constipation, diarrhea, nausea and vomiting.  Genitourinary: Negative for dysuria, flank pain, frequency and hematuria.  Musculoskeletal: Positive for back pain. Negative for arthralgias, gait problem, joint swelling, myalgias and neck pain.  Skin: Negative for color change, rash and wound.  Neurological: Positive for numbness (Chronic). Negative for weakness.    Physical Exam Updated Vital Signs BP (!) 179/106 (BP Location: Left Arm)   Pulse (!) 107   Temp 97.6 F (36.4 C) (Tympanic)   Resp 18   Ht 5\' 8"  (1.727 m)   Wt 97.1 kg   SpO2 97%   BMI 32.54 kg/m   Physical Exam Vitals and nursing note reviewed.  Constitutional:      General: He is not in acute distress.    Appearance: Normal appearance. He is well-developed. He is not diaphoretic.  HENT:     Head: Atraumatic.  Eyes:     General:        Right eye: No discharge.        Left eye: No discharge.  Cardiovascular:     Rate and Rhythm: Normal rate and regular rhythm.     Pulses:          Radial pulses are 2+ on the right side and 2+ on the left side.       Dorsalis pedis pulses are 2+ on the right side and 2+ on the left side.       Posterior tibial pulses are 2+ on the right side and 2+ on the left side.     Heart sounds: Normal heart sounds.  Pulmonary:     Effort: Pulmonary effort is normal. No respiratory distress.     Breath sounds: Normal breath sounds.  Abdominal:     General: Bowel sounds are normal. There is no distension.     Palpations: Abdomen is soft. There is no mass.     Tenderness: There is no abdominal tenderness. There is no guarding.     Comments: Abdomen soft, nondistended, nontender to palpation in all quadrants without guarding or peritoneal signs, no CVA tenderness bilaterally  Musculoskeletal:     Cervical  back: Neck supple.     Comments: Multiple previous surgical scars noted.  Tenderness to palpation over midline and left low back.  Pain made worse with any  movement, or range of motion of the lower extremities in particular on the left.  Skin:    General: Skin is warm and dry.     Capillary Refill: Capillary refill takes less than 2 seconds.  Neurological:     Mental Status: He is alert and oriented to person, place, and time.     Comments: Alert, clear speech, following commands. Moving all extremities without difficulty. Bilateral lower extremities with 5/5 strength in proximal and distal muscle groups and with dorsi and plantar flexion. Sensation intact in bilateral lower extremities. Ambulatory with steady gait  Psychiatric:        Mood and Affect: Mood normal.        Behavior: Behavior normal.     ED Results / Procedures / Treatments   Labs (all labs ordered are listed, but only abnormal results are displayed) Labs Reviewed - No data to display  EKG None  Radiology No results found.  Procedures Procedures (including critical care time)  Medications Ordered in ED Medications  methocarbamol (ROBAXIN) 1,000 mg in dextrose 5 % 100 mL IVPB (has no administration in time range)  lidocaine (LIDODERM) 5 % 1 patch (has no administration in time range)  HYDROmorphone (DILAUDID) injection 1 mg (1 mg Intravenous Given 03/15/20 1757)  ketorolac (TORADOL) 30 MG/ML injection 15 mg (15 mg Intravenous Given 03/15/20 1758)    ED Course  I have reviewed the triage vital signs and the nursing notes.  Pertinent labs & imaging results that were available during my care of the patient were reviewed by me and considered in my medical decision making (see chart for details).    MDM Rules/Calculators/A&P                         59 yo male with history of chronic back pain and multiple previous back surgeries who presents with worsening back pain present at midline and on the left low back.  No new neurologic deficits, no loss of bowel or bladder control.  Patient has had to have multiple lumbar revisions.  He is extremely uncomfortable.  Will  get CT of the lumbar spine and give pain management.  At shift change care signed out to [place] who will follow up on pending CT and reevaluate patient's pain.  If CT shows no new abnormalities or failure of hardware and primary goal would be pain management and discharge for outpatient follow-up with Dr. Ellene Route.  Final Clinical Impression(s) / ED Diagnoses Final diagnoses:  Back pain with history of spinal surgery    Rx / DC Orders ED Discharge Orders    None       Janet Berlin 03/15/20 1901    Carmin Muskrat, MD 03/15/20 2319

## 2020-03-15 NOTE — ED Notes (Signed)
Pt transported to CT ?

## 2020-03-15 NOTE — Discharge Instructions (Addendum)
The CT scan of your lumbar spine did not show any hardware abnormalities or acute bony findings.  Call Dr. Clarice Pole office on Monday to arrange a follow-up appointment.  Stop taking your hydrocodone and cyclobenzaprine

## 2020-03-25 ENCOUNTER — Other Ambulatory Visit: Payer: Self-pay

## 2020-03-25 ENCOUNTER — Emergency Department (HOSPITAL_COMMUNITY): Payer: Medicare PPO

## 2020-03-25 ENCOUNTER — Encounter (HOSPITAL_COMMUNITY): Payer: Self-pay

## 2020-03-25 ENCOUNTER — Emergency Department (HOSPITAL_COMMUNITY)
Admission: EM | Admit: 2020-03-25 | Discharge: 2020-03-25 | Disposition: A | Discharge status: Home / Self Care | Payer: Medicare PPO | Attending: Emergency Medicine | Admitting: Emergency Medicine

## 2020-03-25 DIAGNOSIS — Z79899 Other long term (current) drug therapy: Secondary | ICD-10-CM | POA: Insufficient documentation

## 2020-03-25 DIAGNOSIS — E119 Type 2 diabetes mellitus without complications: Secondary | ICD-10-CM | POA: Insufficient documentation

## 2020-03-25 DIAGNOSIS — Z7982 Long term (current) use of aspirin: Secondary | ICD-10-CM | POA: Diagnosis not present

## 2020-03-25 DIAGNOSIS — Z87891 Personal history of nicotine dependence: Secondary | ICD-10-CM | POA: Diagnosis not present

## 2020-03-25 DIAGNOSIS — Z794 Long term (current) use of insulin: Secondary | ICD-10-CM | POA: Insufficient documentation

## 2020-03-25 DIAGNOSIS — E039 Hypothyroidism, unspecified: Secondary | ICD-10-CM | POA: Insufficient documentation

## 2020-03-25 DIAGNOSIS — Z7984 Long term (current) use of oral hypoglycemic drugs: Secondary | ICD-10-CM | POA: Insufficient documentation

## 2020-03-25 DIAGNOSIS — U071 COVID-19: Secondary | ICD-10-CM | POA: Diagnosis not present

## 2020-03-25 DIAGNOSIS — I10 Essential (primary) hypertension: Secondary | ICD-10-CM | POA: Diagnosis not present

## 2020-03-25 DIAGNOSIS — Z85118 Personal history of other malignant neoplasm of bronchus and lung: Secondary | ICD-10-CM | POA: Insufficient documentation

## 2020-03-25 DIAGNOSIS — R509 Fever, unspecified: Secondary | ICD-10-CM | POA: Diagnosis present

## 2020-03-25 LAB — RESPIRATORY PANEL BY RT PCR (FLU A&B, COVID)
Influenza A by PCR: NEGATIVE
Influenza B by PCR: NEGATIVE
SARS Coronavirus 2 by RT PCR: POSITIVE — AB

## 2020-03-25 NOTE — Discharge Instructions (Addendum)
You have Covid.  I recommend taking Tylenol for fever control and ibuprofen for pain control please follow dosing on the back of bottle.  I recommend staying hydrated and if you do not an appetite, I recommend soups as this will provide you with fluids and calories.   you must self quarantine for 10 days starting on symptom onset.  I would like you to contact "post Covid care" as they will provide you with information how to manage your Covid symptoms.  Contact post Covid care.  Come back to the emergency department if you develop chest pain, shortness of breath, severe abdominal pain, uncontrolled nausea, vomiting, diarrhea.

## 2020-03-25 NOTE — ED Provider Notes (Signed)
[facility] EMERGENCY DEPARTMENT Provider Note   CSN: [id number] Arrival date & time: 03/25/20  1710     History Chief Complaint  Patient presents with  . Fever  . Cough    Tony Christensen is a 59 y.o. male.  HPI   Patient with significant medical history of diabetes, hypertension, right lung nodule presents to the emergency department with URI symptoms.  Patient states on Friday he developed fevers, chills, productive cough, and general body aches.  He denies shortness of breath or chest pain, abdominal pain, nausea, vomiting, diarrhea.  He endorses that he is tolerating p.o. without any difficulty.  He states he is not currently vaccinated for Covid and states that his wife was recently diagnosed with Covid.  Patient states he has been taking ibuprofen Tylenol for fever and pain control seems to be helping.  Patient denies headaches, sore throat, chest pain, shortness of breath, abdominal pain, nausea, vomiting, diarrhea, pedal edema.  Past Medical History:  Diagnosis Date  . Arthritis   . Cancer ([place])    Bladder/lung ( node on right lung-watching)  . Diabetes mellitus without complication ([place])   . GERD (gastroesophageal reflux disease)   . Heart murmur   . History of kidney stones   . Hypertension   . Hypothyroidism   . Kidney cysts   . Kidney stones   . Sleep apnea   . Thyroid disease     Patient Active Problem List   Diagnosis Date Noted  . Pseudoarthrosis of lumbar spine 09/11/2019  . Lumbar stenosis with neurogenic claudication 11/07/2018    Past Surgical History:  Procedure Laterality Date  . ABDOMINAL EXPOSURE N/A 11/07/2018   Procedure: ABDOMINAL EXPOSURE;  Surgeon: Rosetta Posner, MD;  Location: [facility] OR;  Service: Vascular;  Laterality: N/A;  . ANTERIOR LATERAL LUMBAR FUSION WITH PERCUTANEOUS SCREW 3 LEVEL N/A 11/07/2018   Procedure: ANTERIOR LATERAL LUMBAR FUSION LUMBAR THREE-FOUR;  Surgeon: Kristeen Miss, MD;  Location: [facility];  Service: Neurosurgery;   Laterality: N/A;  Lumbar 3-4 Anterolateral lumbar interbody fusion with Lumbar 3 to Sacral 1 Posterior fixation with pedicle screws  . ANTERIOR LUMBAR FUSION N/A 11/07/2018   Procedure: LUMBAR FOUR-FIVE, LUMBAR FIVE-SACRAL ONE ANTERIOR LUMBAR INTERBODY FUSION;  Surgeon: Kristeen Miss, MD;  Location: [facility];  Service: Neurosurgery;  Laterality: N/A;  Lumbar 4-5 Lumbar 5 Sacral 1 Anterior lumbar interbody fusion  . APPLICATION OF ROBOTIC ASSISTANCE FOR SPINAL PROCEDURE N/A 11/07/2018   Procedure: PLACEMENT OF PERCUTANEOUS PEDICLE SCREWS LUMBAR THREE-SACRAL ONE WITH APPLICATION OF ROBOTIC ASSISTANCE;  Surgeon: Kristeen Miss, MD;  Location: [facility];  Service: Neurosurgery;  Laterality: N/A;  . BACK SURGERY    . BLADDER TUMOR EXCISION    . CARPAL TUNNEL WITH CUBITAL TUNNEL Bilateral   . ELBOW SURGERY Bilateral [id number]  . KNEE ARTHROSCOPY W/ MENISCAL REPAIR Right   . NECK SURGERY         Family History  Problem Relation Age of Onset  . Fibromyalgia Mother   . Arthritis Mother   . Brain cancer Father   . Lung cancer Father   . Hypertension Father   . Heart disease Father     Social History   Tobacco Use  . Smoking status: Former Smoker    Quit date: 11/01/1989    Years since quitting: 30.4  . Smokeless tobacco: Never Used  Vaping Use  . Vaping Use: Never used  Substance Use Topics  . Alcohol use: Yes    Comment: socially  . Drug  use: Never    Home Medications Prior to Admission medications   Medication Sig Start Date End Date Taking? Authorizing Provider  Ascorbic Acid (VITAMIN C WITH ROSE HIPS) 1000 MG tablet Take 2,000 mg by mouth daily.    [provider]  Aspirin-Acetaminophen-Caffeine (GOODY HEADACHE PO) Take 1 Package by mouth daily as needed (pain).    [provider]  atorvastatin (LIPITOR) 40 MG tablet Take 40 mg by mouth at bedtime.     [provider]  baclofen (LIORESAL) 10 MG tablet Take 1 tablet (10 mg total) by mouth 3 (three) times daily.  03/15/20   Triplett, Tammy, PA-C  benazepril (LOTENSIN) 10 MG tablet Take 10 mg by mouth daily.     [provider]  Cholecalciferol (VITAMIN D-3) 5000 units TABS Take 5,000 Units by mouth daily.     [provider]  ciprofloxacin (CIPRO) 500 MG tablet Take 1 tablet (500 mg total) by mouth 2 (two) times daily. 09/13/19   Kristeen Miss, MD  diphenhydrAMINE (BENADRYL) 25 mg capsule Take 75 mg by mouth daily.     [provider]  fluticasone (FLONASE) 50 MCG/ACT nasal spray Place 1 spray into both nostrils daily as needed for allergies.  06/19/18   [provider]  HONEY PO Take 15 mLs by mouth daily.     [provider]  levothyroxine (SYNTHROID, LEVOTHROID) 75 MCG tablet Take 75 mcg by mouth daily before breakfast.  08/12/18   [provider]  lidocaine (LIDODERM) 5 % Place 1 patch onto the skin daily. Remove & Discard patch within 12 hours or as directed by MD 03/15/20   Triplett, Tammy, PA-C  magnesium chloride (SLOW-MAG) 64 MG TBEC SR tablet Take 1 tablet (64 mg total) by mouth daily. 11/12/18   Judith Part, MD  meloxicam (MOBIC) 7.5 MG tablet Take 7.5 mg by mouth 2 (two) times daily.  07/07/18   [provider]  metFORMIN (GLUCOPHAGE) 500 MG tablet Take 500 mg by mouth 2 (two) times daily with a meal.    [provider]  metoprolol tartrate (LOPRESSOR) 50 MG tablet Take 50 mg by mouth daily. 03/23/19   [provider]  Multiple Vitamins-Minerals (MULTIVITAMIN WITH MINERALS) tablet Take 1 tablet by mouth daily.    [provider]  nortriptyline (PAMELOR) 10 MG capsule Take 10 mg by mouth at bedtime.    [provider]  NOVOLOG FLEXPEN 100 UNIT/ML FlexPen Inject 8-22 Units into the skin 3 (three) times daily with meals. Takes with meals as needed 06/29/18   [provider]  nystatin (MYCOSTATIN) 100000 UNIT/ML suspension Use as directed 5 mLs in the mouth or throat daily as needed (Mouth  pain).  03/23/19   [provider]  omeprazole (PRILOSEC) 20 MG capsule Take 20 mg by mouth every morning.    [provider]  oxyCODONE-acetaminophen (PERCOCET/ROXICET) 5-325 MG tablet Take 1 tablet by mouth every 4 (four) hours as needed. 03/15/20   Triplett, Tammy, PA-C  pregabalin (LYRICA) 200 MG capsule Take 200 mg by mouth 2 (two) times daily.  07/17/19   [provider]  tamsulosin (FLOMAX) 0.4 MG CAPS capsule Take 0.4 mg by mouth every evening.    [provider]  zinc gluconate 50 MG tablet Take 100 mg by mouth daily.    [provider]    Allergies    Ceclor [cefaclor]  Review of Systems   Review of Systems  Constitutional: Positive for chills and fever.  HENT:  Positive for congestion. Negative for tinnitus, trouble swallowing and voice change.   Respiratory: Positive for cough. Negative for shortness of breath.   Cardiovascular: Negative for chest pain.  Gastrointestinal: Negative for abdominal pain, diarrhea, nausea and vomiting.  Genitourinary: Negative for enuresis, flank pain and frequency.  Musculoskeletal: Negative for back pain.  Skin: Negative for rash.  Neurological: Negative for dizziness and headaches.  Hematological: Does not bruise/bleed easily.    Physical Exam Updated Vital Signs BP (!) 170/103 (BP Location: Right Arm)   Pulse 94   Temp 98.7 F (37.1 C) (Oral)   Resp 20   Ht 5\' 8"  (1.727 m)   Wt 97 kg   SpO2 95%   BMI 32.52 kg/m   Physical Exam Vitals and nursing note reviewed.  Constitutional:      General: He is not in acute distress.    Appearance: He is not ill-appearing.  HENT:     Head: Normocephalic and atraumatic.     Nose: Congestion present.     Mouth/Throat:     Mouth: Mucous membranes are moist.     Pharynx: Oropharynx is clear. No oropharyngeal exudate or posterior oropharyngeal erythema.  Eyes:     General: No scleral icterus. Cardiovascular:     Rate and Rhythm: Normal rate and  regular rhythm.     Pulses: Normal pulses.     Heart sounds: No murmur heard.  No friction rub. No gallop.   Pulmonary:     Effort: No respiratory distress.     Breath sounds: No wheezing, rhonchi or rales.  Abdominal:     General: There is no distension.     Tenderness: There is no abdominal tenderness. There is no right CVA tenderness, left CVA tenderness or guarding.  Musculoskeletal:        General: No swelling.     Right lower leg: No edema.     Left lower leg: No edema.  Skin:    General: Skin is warm and dry.     Findings: No rash.  Neurological:     Mental Status: He is alert.  Psychiatric:        Mood and Affect: Mood normal.     ED Results / Procedures / Treatments   Labs (all labs ordered are listed, but only abnormal results are displayed) Labs Reviewed  RESPIRATORY PANEL BY RT PCR (FLU A&B, COVID) - Abnormal; Notable for the following components:      Result Value   SARS Coronavirus 2 by RT PCR POSITIVE (*)    All other components within normal limits    EKG None  Radiology DG Chest Portable 1 View  Result Date: 03/25/2020 CLINICAL DATA:  59 year old male with cough. EXAM: PORTABLE CHEST 1 VIEW COMPARISON:  None. FINDINGS: The lungs are clear. There is no pleural effusion pneumothorax. The cardiac silhouette is within limits. No acute osseous pathology. Cervical spine ACDF. IMPRESSION: No active disease. Electronically Signed   By: Anner Crete M.D.   On: 03/25/2020 18:41    Procedures Procedures (including critical care time)  Medications Ordered in ED Medications - No data to display  ED Course  I have reviewed the triage vital signs and the nursing notes.  Pertinent labs & imaging results that were available during my care of the patient were reviewed by me and considered in my medical decision making (see chart for details).  Clinical Course as of Mar 26 2323  Mon Mar 25, 2020  2054 DG Chest Portable 1 View [  WF]    Clinical Course User  Index [WF] Aron Baba   MDM Rules/Calculators/A&P                          Patient presents with URI-like symptoms.  He is alert, does not appear in acute distress, vital signs reassuring will order chest x-ray and respiratory panel for further evaluation.  Respiratory panel positive for Covid, chest x-ray does not reveal any acute findings.  Low suspicion for systemic infection as patient is nontoxic-appearing, vital signs reassuring, no obvious source infection noted on exam.  Low suspicion for pneumonia as lung sounds are clear bilaterally, x-ray did not reveal any acute findings.  I have low suspicion for PE as patient denies pleuritic chest pain, shortness of breath, vital signs reassuring.  low suspicion for strep throat as oropharynx was visualized, no erythema or exudates noted.  Low suspicion patient would need  hospitalized due to Covid as vital signs reassuring, patient is not in respiratory distress.  I suspect patient's symptoms are secondary to Covid, due to his comorbidities of diabetes hypertension will refer him to infusion clinic.  Will provide him with post Covid care.  Vital signs have remained stable, no indication for hospital admission.  Patient given at home care as well strict return precautions.  Patient verbalized that they understood agreed to said plan.   Final Clinical Impression(s) / ED Diagnoses Final diagnoses:  COVID    Rx / DC Orders ED Discharge Orders    None       Marcello Fennel, PA-C 03/25/20 2324    Daleen Bo, MD 03/26/20 [phone]

## 2020-03-25 NOTE — ED Triage Notes (Signed)
Pt reports wife has covid.  Reports cough, fever, achy, sinus drainage, and headache.

## 2020-03-26 ENCOUNTER — Other Ambulatory Visit: Payer: Self-pay | Admitting: Nurse Practitioner

## 2020-03-26 ENCOUNTER — Ambulatory Visit (HOSPITAL_COMMUNITY)
Admission: RE | Admit: 2020-03-26 | Discharge: 2020-03-26 | Disposition: A | Discharge status: Home / Self Care | Payer: Medicare PPO | Source: Ambulatory Visit | Attending: Pulmonary Disease | Admitting: Pulmonary Disease

## 2020-03-26 DIAGNOSIS — E66811 Obesity, class 1: Secondary | ICD-10-CM

## 2020-03-26 DIAGNOSIS — Z23 Encounter for immunization: Secondary | ICD-10-CM | POA: Insufficient documentation

## 2020-03-26 DIAGNOSIS — U071 COVID-19: Secondary | ICD-10-CM

## 2020-03-26 DIAGNOSIS — Z6832 Body mass index (BMI) 32.0-32.9, adult: Secondary | ICD-10-CM | POA: Insufficient documentation

## 2020-03-26 DIAGNOSIS — E6609 Other obesity due to excess calories: Secondary | ICD-10-CM | POA: Insufficient documentation

## 2020-03-26 MED ORDER — SODIUM CHLORIDE 0.9 % IV SOLN: INTRAVENOUS | Status: DC | PRN

## 2020-03-26 MED ORDER — SOTROVIMAB 500 MG/8ML IV SOLN
500.0000 mg | Freq: Once | INTRAVENOUS | Status: AC
  Administered 2020-03-26: 500 mg via INTRAVENOUS

## 2020-03-26 MED ORDER — EPINEPHRINE 0.3 MG/0.3ML IJ SOAJ: 0.3000 mg | Freq: Once | INTRAMUSCULAR | Status: DC | PRN

## 2020-03-26 MED ORDER — FAMOTIDINE IN NACL 20-0.9 MG/50ML-% IV SOLN: 20.0000 mg | Freq: Once | INTRAVENOUS | Status: DC | PRN

## 2020-03-26 MED ORDER — DIPHENHYDRAMINE HCL 50 MG/ML IJ SOLN: 50.0000 mg | Freq: Once | INTRAMUSCULAR | Status: DC | PRN

## 2020-03-26 MED ORDER — METHYLPREDNISOLONE SODIUM SUCC 125 MG IJ SOLR: 125.0000 mg | Freq: Once | INTRAMUSCULAR | Status: DC | PRN

## 2020-03-26 MED ORDER — ALBUTEROL SULFATE HFA 108 (90 BASE) MCG/ACT IN AERS: 2.0000 | INHALATION_SPRAY | Freq: Once | RESPIRATORY_TRACT | Status: DC | PRN

## 2020-03-26 NOTE — Discharge Instructions (Signed)

## 2020-03-26 NOTE — Progress Notes (Signed)
I connected by phone with [name] on 03/26/2020 at 11:25 AM to discuss the potential use of a new treatment for mild to moderate COVID-19 viral infection in non-hospitalized patients.  This patient is a 59 y.o. male that meets the FDA criteria for Emergency Use Authorization of COVID monoclonal antibody casirivimab/imdevimab, bamlanivimab/eteseviamb, or sotrovimab.  Has a (+) direct SARS-CoV-2 viral test result  Has mild or moderate COVID-19   Is NOT hospitalized due to COVID-19  Is within 10 days of symptom onset  Has at least one of the high risk factor(s) for progression to severe COVID-19 and/or hospitalization as defined in EUA.  Specific high risk criteria : BMI > 25   I have spoken and communicated the following to the patient or parent/caregiver regarding COVID monoclonal antibody treatment:  1. FDA has authorized the emergency use for the treatment of mild to moderate COVID-19 in adults and pediatric patients with positive results of direct SARS-CoV-2 viral testing who are 67 years of age and older weighing at least 40 kg, and who are at high risk for progressing to severe COVID-19 and/or hospitalization.  2. The significant known and potential risks and benefits of COVID monoclonal antibody, and the extent to which such potential risks and benefits are unknown.  3. Information on available alternative treatments and the risks and benefits of those alternatives, including clinical trials.  4. Patients treated with COVID monoclonal antibody should continue to self-isolate and use infection control measures (e.g., wear mask, isolate, social distance, avoid sharing personal items, clean and disinfect "high touch" surfaces, and frequent handwashing) according to CDC guidelines.   5. The patient or parent/caregiver has the option to accept or refuse COVID monoclonal antibody treatment.  After reviewing this information with the patient, the patient has agreed to receive one of  the available covid 19 monoclonal antibodies and will be provided an appropriate fact sheet prior to infusion. Fenton Foy, NP 03/26/2020 11:25 AM

## 2020-06-14 ENCOUNTER — Other Ambulatory Visit (HOSPITAL_COMMUNITY): Payer: Self-pay | Admitting: Neurological Surgery

## 2020-06-14 DIAGNOSIS — M5416 Radiculopathy, lumbar region: Secondary | ICD-10-CM

## 2020-07-04 ENCOUNTER — Other Ambulatory Visit: Payer: Self-pay

## 2020-07-04 ENCOUNTER — Ambulatory Visit (HOSPITAL_COMMUNITY)
Admission: RE | Admit: 2020-07-04 | Discharge: 2020-07-04 | Disposition: A | Discharge status: Home / Self Care | Payer: Medicare PPO | Source: Ambulatory Visit | Attending: Neurological Surgery | Admitting: Neurological Surgery

## 2020-07-04 DIAGNOSIS — M5416 Radiculopathy, lumbar region: Secondary | ICD-10-CM | POA: Diagnosis not present

## 2020-07-04 MED ORDER — GADOBUTROL 1 MMOL/ML IV SOLN
10.0000 mL | Freq: Once | INTRAVENOUS | Status: AC | PRN
  Administered 2020-07-04: 10 mL via INTRAVENOUS

## 2021-04-18 IMAGING — CT CT L SPINE W/O CM
3 of 4 series · 10 of 33 positions shown, 11 images · non-contrast
Comparison: Radiography 09/11/2019.  CT 07/19/2019.

CLINICAL DATA: Follow-up fusion revision.

EXAM:
CT LUMBAR SPINE WITHOUT CONTRAST
TECHNIQUE: Multidetector CT imaging of the lumbar spine was performed without
intravenous contrast administration. Multiplanar CT image
reconstructions were also generated.

[Series 6: sagittal bone · sagittal · 0.31mm/px · 5 of 61 slices shown]
[im 21/61  bone]
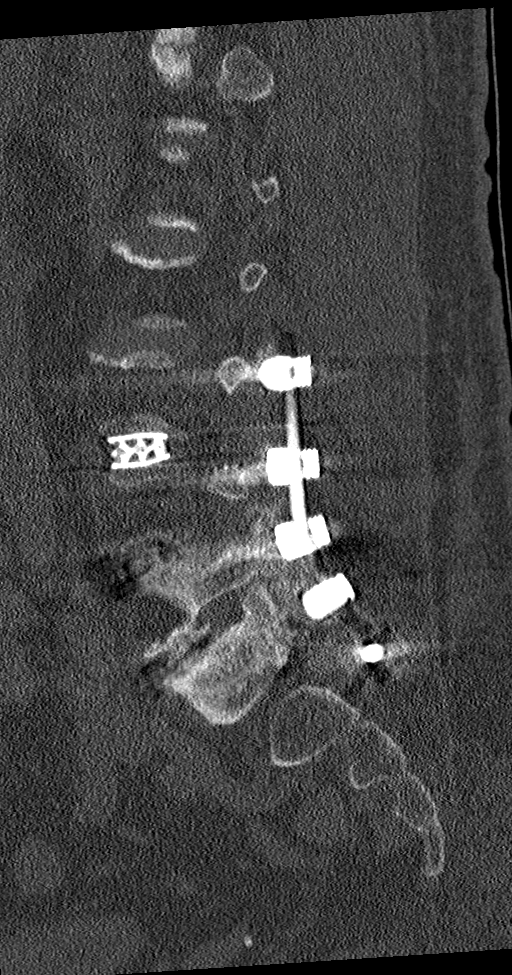
[im 26/61  bone]
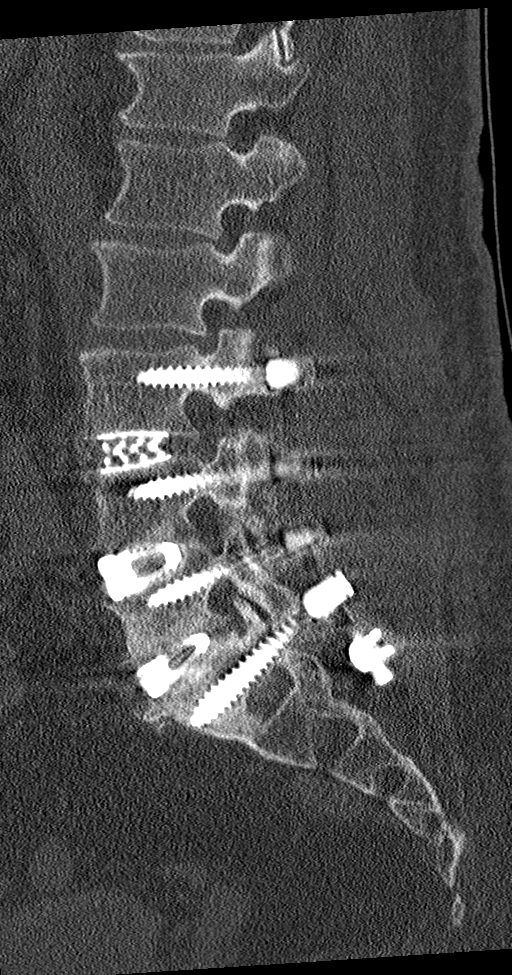
[im 31/61  bone]
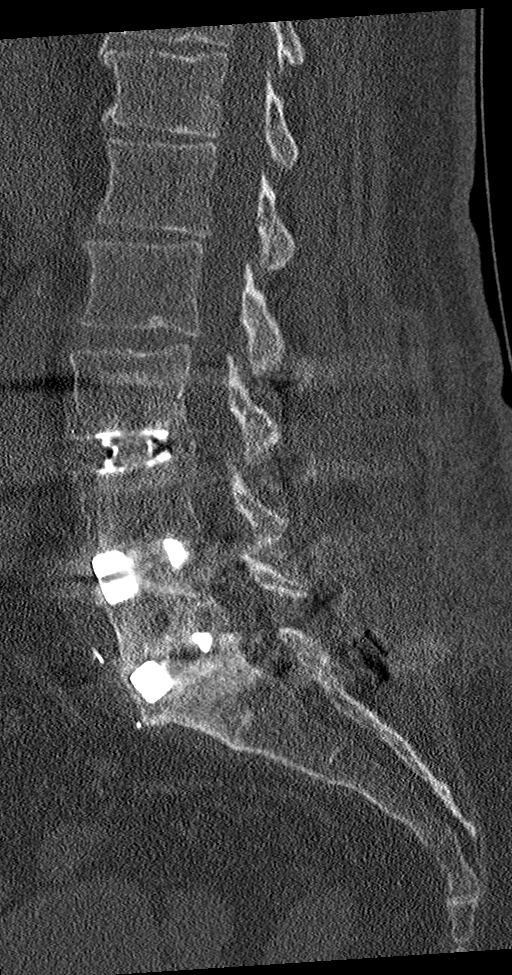
[im 36/61  bone]
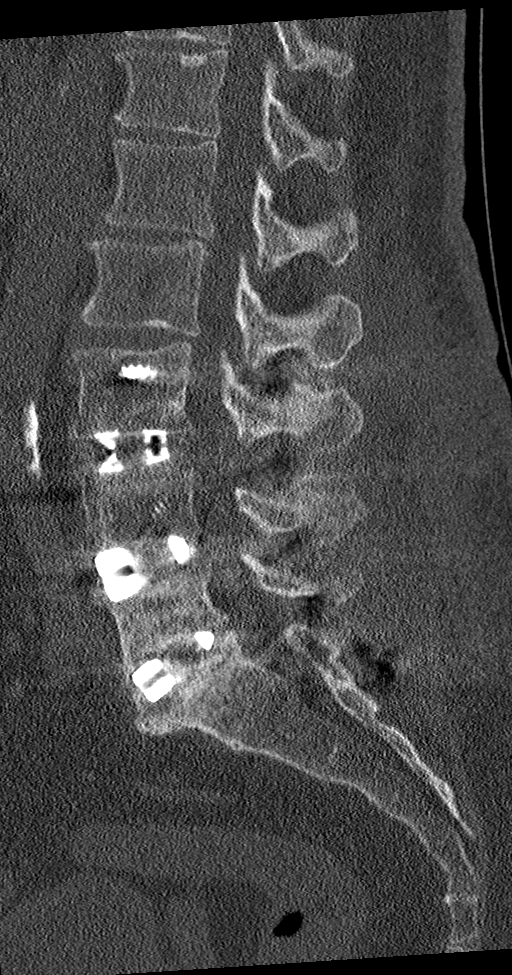
[im 41/61  bone]
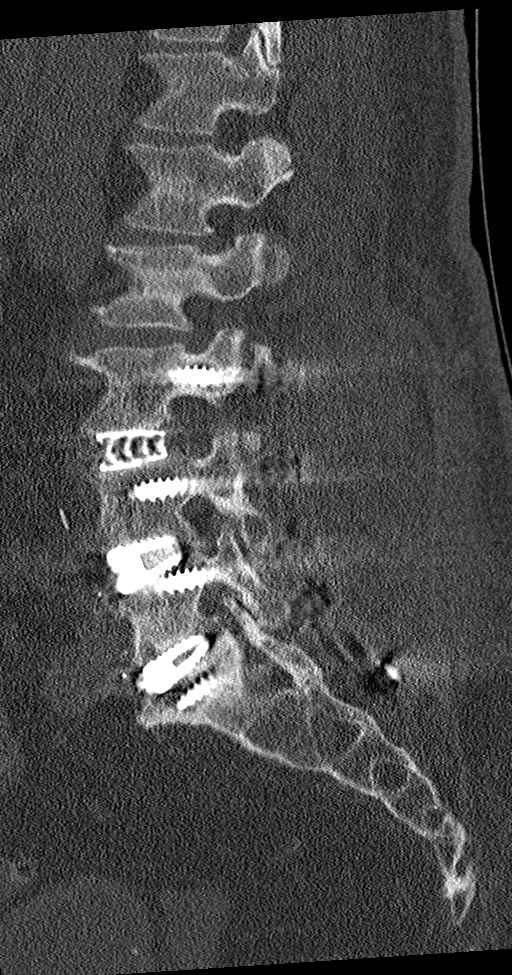

[Series 7: coronal bone · coronal · 0.25mm/px · 3 of 81 slices shown]
[im 17/81  bone]
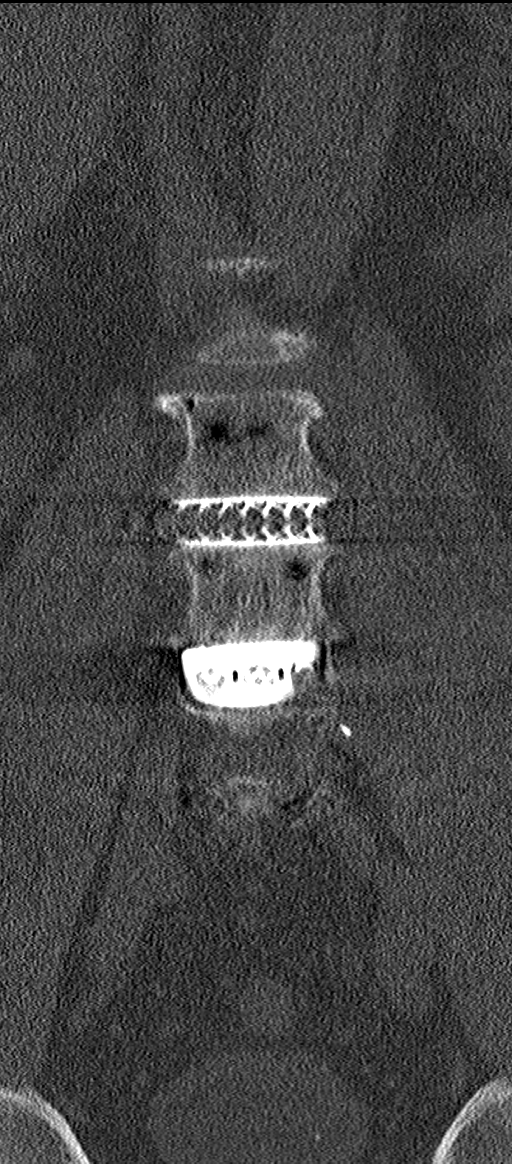
[im 33/81  bone]
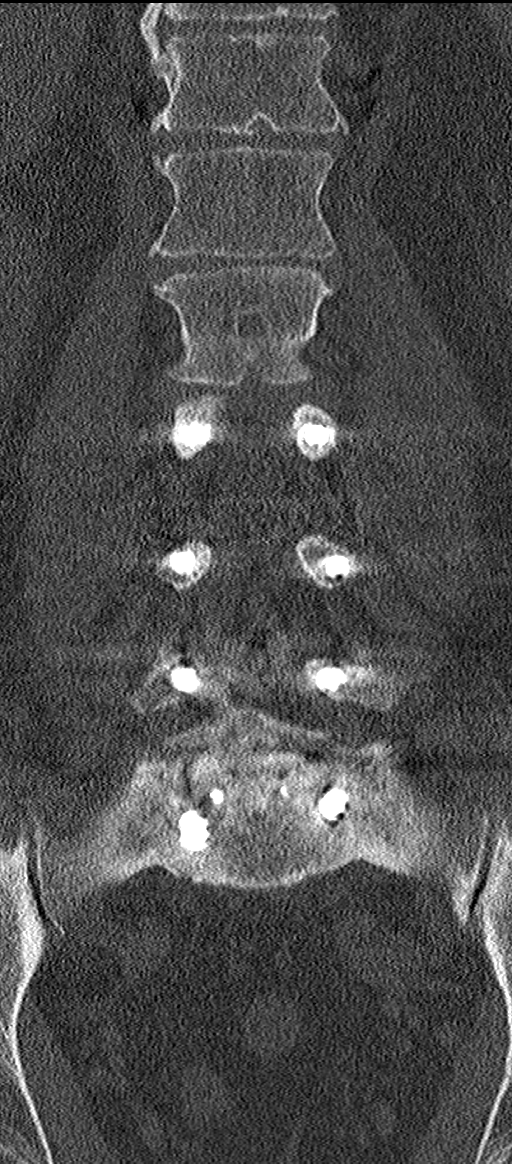
[im 49/81  bone]
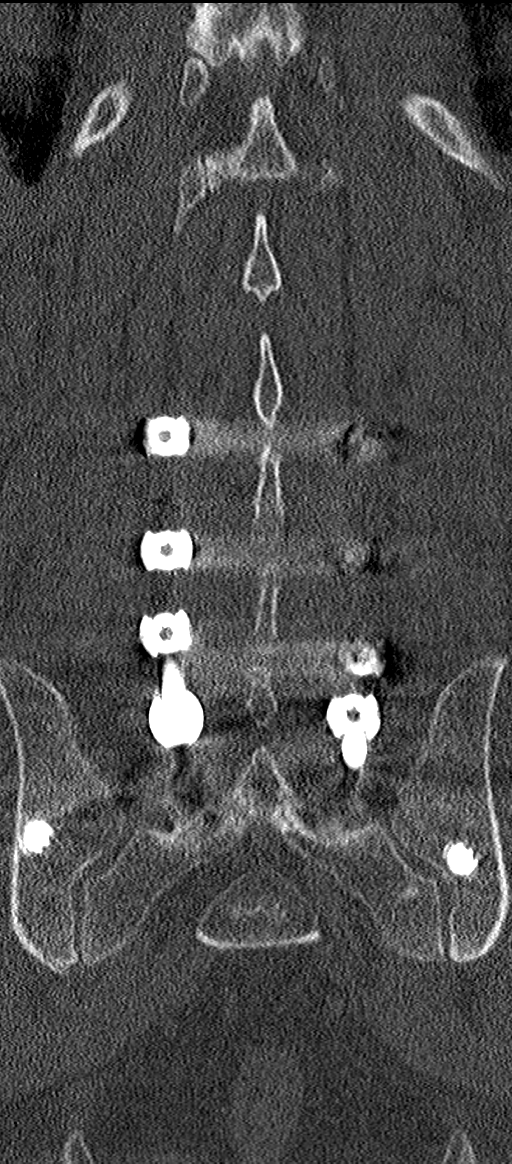

[Series 8: ax disc · axial · 0.21mm/px · z∈[+986,+1089]mm · 2 of 116 slices shown, 3 images]
[im 47/116  soft-tissue]
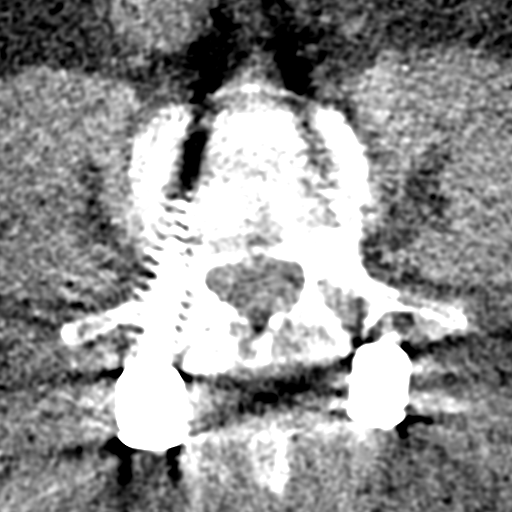
[im 47/116  bone]
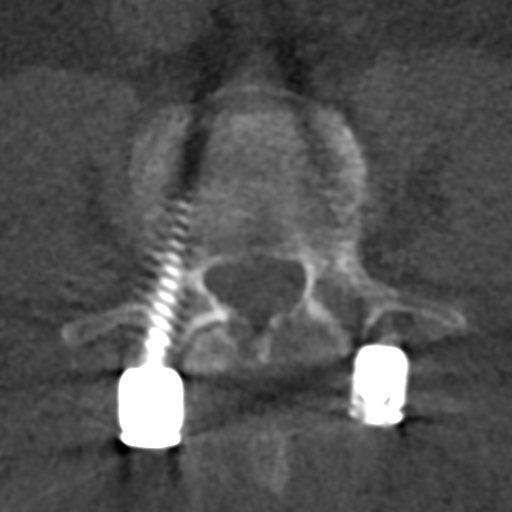
[im 93/116  bone]
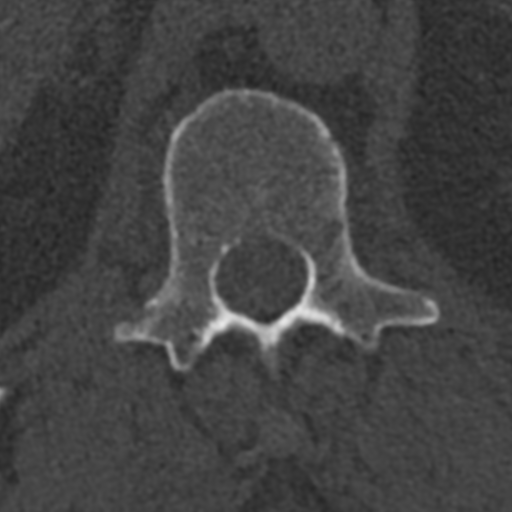

[10 of 33 positions shown; findings below may reference images not displayed]

FINDINGS: Segmentation: 5 lumbar type vertebral bodies as numbered previously.

Alignment: Persistent normal alignment with exception of 1 or 2 mm
of retrolisthesis at L2-3.

Vertebrae: Previous discectomy and fusion procedures from L3 to the
sacrum. Interval extension of the fusion with bilateral iliac bone
extensions.

Paraspinal and other soft tissues: Negative

Disc levels: No significant finding from T11-12 through T12-L1.

L1-2: Mild noncompressive disc bulge.

L2-3: 1 or 2 mm retrolisthesis. Mild bulging of the disc. Mild canal
narrowing without likely neural compression.

L3 to sacrum: Previous lateral discectomy and fusion at L3-4.
Pedicle screws well positioned. Solid union. Anterior discectomy and
fusion at L4-5. Pedicle screws well position. Solid union. No
loosening. Anterior scalp to me in fusion at L5-S1. Since the
previous study, there is filling in of sclerosis around the S1
screws suggesting that the iliac extensions have been successful and
that solid union is occurring without ongoing motion. Iliac
extensions have a good appearance without evidence of screw
loosening or hardware failure.
IMPRESSION: Interval bilateral iliac extensions have a good appearance. It
appears that fusion is occurring at the L5-S1 level, with filling in
of the lucency around the S1 screws seen previously.

Mild degenerative changes at L2-3 with 1 or 2 mm of retrolisthesis
and bulging of the disc but no apparent compressive stenosis.

## 2021-07-14 ENCOUNTER — Other Ambulatory Visit (HOSPITAL_COMMUNITY): Payer: Self-pay | Admitting: Neurological Surgery

## 2021-07-14 ENCOUNTER — Other Ambulatory Visit: Payer: Self-pay | Admitting: Neurological Surgery

## 2021-07-14 DIAGNOSIS — M47816 Spondylosis without myelopathy or radiculopathy, lumbar region: Secondary | ICD-10-CM

## 2021-07-20 ENCOUNTER — Emergency Department (HOSPITAL_COMMUNITY)
Admission: EM | Admit: 2021-07-20 | Discharge: 2021-07-20 | Disposition: A | Discharge status: Home / Self Care | Payer: Medicare PPO | Attending: Emergency Medicine | Admitting: Emergency Medicine

## 2021-07-20 ENCOUNTER — Other Ambulatory Visit: Payer: Self-pay

## 2021-07-20 ENCOUNTER — Encounter (HOSPITAL_COMMUNITY): Payer: Self-pay | Admitting: *Deleted

## 2021-07-20 DIAGNOSIS — E1165 Type 2 diabetes mellitus with hyperglycemia: Secondary | ICD-10-CM | POA: Insufficient documentation

## 2021-07-20 DIAGNOSIS — M79604 Pain in right leg: Secondary | ICD-10-CM | POA: Diagnosis not present

## 2021-07-20 DIAGNOSIS — I10 Essential (primary) hypertension: Secondary | ICD-10-CM | POA: Insufficient documentation

## 2021-07-20 DIAGNOSIS — Z794 Long term (current) use of insulin: Secondary | ICD-10-CM | POA: Diagnosis not present

## 2021-07-20 DIAGNOSIS — Z79899 Other long term (current) drug therapy: Secondary | ICD-10-CM | POA: Diagnosis not present

## 2021-07-20 DIAGNOSIS — M549 Dorsalgia, unspecified: Secondary | ICD-10-CM | POA: Insufficient documentation

## 2021-07-20 DIAGNOSIS — Z7984 Long term (current) use of oral hypoglycemic drugs: Secondary | ICD-10-CM | POA: Diagnosis not present

## 2021-07-20 LAB — CBC WITH DIFFERENTIAL/PLATELET
Abs Immature Granulocytes: 0.03 10*3/uL (ref 0.00–0.07)
Basophils Absolute: 0 10*3/uL (ref 0.0–0.1)
Basophils Relative: 0 %
Eosinophils Absolute: 0.3 10*3/uL (ref 0.0–0.5)
Eosinophils Relative: 4 %
HCT: 41.1 % (ref 39.0–52.0)
Hemoglobin: 14.5 g/dL (ref 13.0–17.0)
Immature Granulocytes: 0 %
Lymphocytes Relative: 27 %
Lymphs Abs: 2.6 10*3/uL (ref 0.7–4.0)
MCH: 31.4 pg (ref 26.0–34.0)
MCHC: 35.3 g/dL (ref 30.0–36.0)
MCV: 89 fL (ref 80.0–100.0)
Monocytes Absolute: 0.7 10*3/uL (ref 0.1–1.0)
Monocytes Relative: 7 %
Neutro Abs: 6 10*3/uL (ref 1.7–7.7)
Neutrophils Relative %: 62 %
Platelets: 271 10*3/uL (ref 150–400)
RBC: 4.62 MIL/uL (ref 4.22–5.81)
RDW: 12.4 % (ref 11.5–15.5)
WBC: 9.7 10*3/uL (ref 4.0–10.5)
nRBC: 0 % (ref 0.0–0.2)

## 2021-07-20 LAB — BASIC METABOLIC PANEL
Anion gap: 10 (ref 5–15)
BUN: 10 mg/dL (ref 6–20)
CO2: 23 mmol/L (ref 22–32)
Calcium: 8.5 mg/dL — ABNORMAL LOW (ref 8.9–10.3)
Chloride: 100 mmol/L (ref 98–111)
Creatinine, Ser: 0.91 mg/dL (ref 0.61–1.24)
GFR, Estimated: >60 mL/min (ref >60)
Glucose, Bld: 190 mg/dL — ABNORMAL HIGH (ref 70–99)
Potassium: 3.7 mmol/L (ref 3.5–5.1)
Sodium: 133 mmol/L — ABNORMAL LOW (ref 135–145)

## 2021-07-20 MED ORDER — METOPROLOL TARTRATE 50 MG PO TABS
50.0000 mg | ORAL_TABLET | Freq: Once | ORAL | Status: AC
  Administered 2021-07-20: 50 mg via ORAL
  Filled 2021-07-20: qty 1

## 2021-07-20 MED ORDER — HYDROMORPHONE HCL 1 MG/ML IJ SOLN
1.0000 mg | Freq: Once | INTRAMUSCULAR | Status: AC
  Administered 2021-07-20: 1 mg via INTRAVENOUS
  Filled 2021-07-20: qty 1

## 2021-07-20 MED ORDER — ENOXAPARIN SODIUM 100 MG/ML IJ SOSY
1.0000 mg/kg | PREFILLED_SYRINGE | Freq: Once | INTRAMUSCULAR | Status: AC
  Administered 2021-07-20: 95 mg via SUBCUTANEOUS
  Filled 2021-07-20: qty 1

## 2021-07-20 MED ORDER — KETOROLAC TROMETHAMINE 30 MG/ML IJ SOLN
30.0000 mg | Freq: Once | INTRAMUSCULAR | Status: AC
  Administered 2021-07-20: 30 mg via INTRAVENOUS
  Filled 2021-07-20: qty 1

## 2021-07-20 NOTE — ED Provider Notes (Signed)
[facility] Provider Note   CSN: [id number] Arrival date & time: 07/20/21  1720     History  Chief Complaint  Patient presents with   Leg Pain    Tony Christensen is a 61 y.o. male.   Leg Pain Associated symptoms: back pain   Associated symptoms: no fever       Tony Christensen is a 61 y.o. male with past medical history for type 2 diabetes, hypertension, kidney stones, and chronic back pain with multiple spinal surgeries who presents to the Emergency Department complaining of gradually worsening right leg pain x2 months.  2 weeks ago, he notes having swelling of his entire right leg.  Endorses hypersensitivity to touch of his entire leg which is worse in the medial and posterior aspects.  Pain of the leg is worse with weightbearing.  He also has numbness to the bilateral feet with right greater than left but states this is chronic since his previous back surgery.  He denies any history of PE or DVT.  States he has not been sedentary, he is a non-smoker, no recent injury and denies any chest pain or shortness of breath.   Home Medications Prior to Admission medications   Medication Sig Start Date End Date Taking? Authorizing Provider  Ascorbic Acid (VITAMIN C WITH ROSE HIPS) 1000 MG tablet Take 2,000 mg by mouth daily.    [provider]  Aspirin-Acetaminophen-Caffeine (GOODY HEADACHE PO) Take 1 Package by mouth daily as needed (pain).    [provider]  atorvastatin (LIPITOR) 40 MG tablet Take 40 mg by mouth at bedtime.     [provider]  baclofen (LIORESAL) 10 MG tablet Take 1 tablet (10 mg total) by mouth 3 (three) times daily. 03/15/20   Raechel Marcos, PA-C  benazepril (LOTENSIN) 10 MG tablet Take 10 mg by mouth daily.     [provider]  Cholecalciferol (VITAMIN D-3) 5000 units TABS Take 5,000 Units by mouth daily.     [provider]  ciprofloxacin (CIPRO) 500 MG tablet Take 1 tablet (500 mg total) by mouth  2 (two) times daily. 09/13/19   Kristeen Miss, MD  diphenhydrAMINE (BENADRYL) 25 mg capsule Take 75 mg by mouth daily.     [provider]  fluticasone (FLONASE) 50 MCG/ACT nasal spray Place 1 spray into both nostrils daily as needed for allergies.  06/19/18   [provider]  HONEY PO Take 15 mLs by mouth daily.     [provider]  levothyroxine (SYNTHROID, LEVOTHROID) 75 MCG tablet Take 75 mcg by mouth daily before breakfast.  08/12/18   [provider]  lidocaine (LIDODERM) 5 % Place 1 patch onto the skin daily. Remove & Discard patch within 12 hours or as directed by MD 03/15/20   Emileo Semel, PA-C  magnesium chloride (SLOW-MAG) 64 MG TBEC SR tablet Take 1 tablet (64 mg total) by mouth daily. 11/12/18   Judith Part, MD  meloxicam (MOBIC) 7.5 MG tablet Take 7.5 mg by mouth 2 (two) times daily.  07/07/18   [provider]  metFORMIN (GLUCOPHAGE) 500 MG tablet Take 500 mg by mouth 2 (two) times daily with a meal.    [provider]  metoprolol tartrate (LOPRESSOR) 50 MG tablet Take 50 mg by mouth daily. 03/23/19   [provider]  Multiple Vitamins-Minerals (MULTIVITAMIN WITH MINERALS) tablet Take 1 tablet by mouth daily.    [provider]  nortriptyline (PAMELOR) 10 MG capsule Take  10 mg by mouth at bedtime.    [provider]  NOVOLOG FLEXPEN 100 UNIT/ML FlexPen Inject 8-22 Units into the skin 3 (three) times daily with meals. Takes with meals as needed 06/29/18   [provider]  nystatin (MYCOSTATIN) 100000 UNIT/ML suspension Use as directed 5 mLs in the mouth or throat daily as needed (Mouth pain).  03/23/19   [provider]  omeprazole (PRILOSEC) 20 MG capsule Take 20 mg by mouth every morning.    [provider]  oxyCODONE-acetaminophen (PERCOCET/ROXICET) 5-325 MG tablet Take 1 tablet by mouth every 4 (four) hours as needed. 03/15/20   Rande Dario, PA-C  pregabalin (LYRICA)  200 MG capsule Take 200 mg by mouth 2 (two) times daily.  07/17/19   [provider]  tamsulosin (FLOMAX) 0.4 MG CAPS capsule Take 0.4 mg by mouth every evening.    [provider]  zinc gluconate 50 MG tablet Take 100 mg by mouth daily.    [provider]      Allergies    Ceclor [cefaclor]    Review of Systems   Review of Systems  Constitutional:  Negative for chills and fever.  Respiratory:  Negative for shortness of breath.   Cardiovascular:  Positive for leg swelling. Negative for chest pain.  Musculoskeletal:  Positive for back pain and myalgias (right leg pain and swelling).  Skin:  Negative for wound.  All other systems reviewed and are negative.  Physical Exam Updated Vital Signs BP (!) 178/115    Pulse (!) 113    Temp 97.8 F (36.6 C) (Oral)    Resp 17    Ht '5\' 8"'$  (1.727 m)    Wt 95.3 kg    SpO2 99%    BMI 31.93 kg/m  Physical Exam Vitals and nursing note reviewed.  Constitutional:      General: He is not in acute distress.    Appearance: Normal appearance.     Comments: Patient appears uncomfortable, writhing around on the stretcher.  Cardiovascular:     Rate and Rhythm: Normal rate and regular rhythm.     Pulses: Normal pulses.  Pulmonary:     Effort: Pulmonary effort is normal.  Musculoskeletal:     Right lower leg: Edema present.     Comments: Patient has diffuse tenderness to palpation of the entire right leg.  Right lower leg does appear edematous and skin is somewhat shiny.  He has several areas of excoriation to both legs.  No significant erythema, no lymphangitis.  No soft tissue crepitus  Skin:    General: Skin is warm.     Capillary Refill: Capillary refill takes less than 2 seconds.     Findings: No erythema.  Neurological:     General: No focal deficit present.     Mental Status: He is alert.     Motor: No weakness.    ED Results / Procedures / Treatments   Labs (all labs ordered are listed, but only abnormal results are  displayed) Labs Reviewed  BASIC METABOLIC PANEL - Abnormal; Notable for the following components:      Result Value   Sodium 133 (*)    Glucose, Bld 190 (*)    Calcium 8.5 (*)    All other components within normal limits  CBC WITH DIFFERENTIAL/PLATELET    EKG None  Radiology No results found.  Procedures Procedures    Medications Ordered in ED Medications  enoxaparin (LOVENOX) injection 95 mg (has no administration in time  range)  HYDROmorphone (DILAUDID) injection 1 mg (1 mg Intravenous Given 07/20/21 1935)  ketorolac (TORADOL) 30 MG/ML injection 30 mg (30 mg Intravenous Given 07/20/21 2058)  metoprolol tartrate (LOPRESSOR) tablet 50 mg (50 mg Oral Given 07/20/21 2100)    ED Course/ Medical Decision Making/ A&P                           Medical Decision Making Patient here for evaluation of pain and swelling of his right leg.  He has chronic back pain with multiple surgeries, no recent injury notes swelling of his leg x2 weeks.  No history of DVT or PE.  He is not currently on any blood thinners.  He is not having any chest pain or shortness of breath.  On exam, patient is having pain to the entire right leg.  Right lower leg does appear edematous and somewhat shiny.  No significant erythema no palpable cord.  He does have palpable dorsalis pedis pulse.  Patient's exam is concerning for DVT.  Doubt cellulitis.  Will obtain labs.  He will need ultrasound of the lower extremity, unfortunately this is unavailable after hours.  Will prophylactically treat with Lovenox tonight and patient is agreeable to return tomorrow morning for ultrasound study of his right leg.  Amount and/or Complexity of Data Reviewed Labs: ordered.    Details: Labs reviewed by me, no leukocytosis.  Hemoglobin unremarkable.  Chemistry show blood glucose of 190.  Kidney functions or unremarkable. ECG/medicine tests: ordered.  Risk Prescription drug management.   Patient has been scheduled for ultrasound  study of the right leg for tomorrow as outpatient.  Given subcu Lovenox tonight        Final Clinical Impression(s) / ED Diagnoses Final diagnoses:  Right leg pain    Rx / DC Orders ED Discharge Orders     None         Bufford Lope 07/20/21 2329    Godfrey Pick, MD 07/25/21 1134

## 2021-07-20 NOTE — ED Triage Notes (Signed)
Pt with right leg pain for 2 weeks, worse today.  C/o swelling.  Denies any injury.   ?

## 2021-07-20 NOTE — Discharge Instructions (Signed)
You have been scheduled to have an ultrasound study of your right leg for tomorrow.  Please call the radiology scheduling department tomorrow morning at [phone] to schedule an appointment time.  You have been given medication this evening to help thin your blood in the event that you have a blood clot.  Please follow-up with your primary care provider this week for recheck. ?

## 2021-07-21 ENCOUNTER — Ambulatory Visit (HOSPITAL_COMMUNITY)
Admission: RE | Admit: 2021-07-21 | Discharge: 2021-07-21 | Disposition: A | Discharge status: Home / Self Care | Payer: Medicare PPO | Source: Ambulatory Visit | Attending: Emergency Medicine | Admitting: Emergency Medicine

## 2021-07-21 DIAGNOSIS — R600 Localized edema: Secondary | ICD-10-CM | POA: Diagnosis present

## 2021-07-21 DIAGNOSIS — M79604 Pain in right leg: Secondary | ICD-10-CM | POA: Diagnosis not present

## 2021-07-29 ENCOUNTER — Other Ambulatory Visit: Payer: Self-pay

## 2021-07-29 ENCOUNTER — Ambulatory Visit (HOSPITAL_COMMUNITY)
Admission: RE | Admit: 2021-07-29 | Discharge: 2021-07-29 | Disposition: A | Discharge status: Home / Self Care | Payer: Medicare PPO | Source: Ambulatory Visit | Attending: Neurological Surgery | Admitting: Neurological Surgery

## 2021-07-29 DIAGNOSIS — M47816 Spondylosis without myelopathy or radiculopathy, lumbar region: Secondary | ICD-10-CM | POA: Diagnosis present

## 2021-07-29 MED ORDER — GADOBUTROL 1 MMOL/ML IV SOLN
10.0000 mL | Freq: Once | INTRAVENOUS | Status: AC | PRN
  Administered 2021-07-29: 10 mL via INTRAVENOUS

## 2023-05-17 ENCOUNTER — Emergency Department (HOSPITAL_COMMUNITY): Payer: Medicare PPO

## 2023-05-17 ENCOUNTER — Emergency Department (HOSPITAL_COMMUNITY)
Admission: EM | Admit: 2023-05-17 | Discharge: 2023-05-18 | Disposition: A | Discharge status: Home / Self Care | Payer: Medicare PPO | Attending: Emergency Medicine | Admitting: Emergency Medicine

## 2023-05-17 ENCOUNTER — Other Ambulatory Visit: Payer: Self-pay

## 2023-05-17 ENCOUNTER — Encounter (HOSPITAL_COMMUNITY): Payer: Self-pay

## 2023-05-17 DIAGNOSIS — M791 Myalgia, unspecified site: Secondary | ICD-10-CM | POA: Diagnosis not present

## 2023-05-17 DIAGNOSIS — K0889 Other specified disorders of teeth and supporting structures: Secondary | ICD-10-CM | POA: Insufficient documentation

## 2023-05-17 DIAGNOSIS — E039 Hypothyroidism, unspecified: Secondary | ICD-10-CM | POA: Diagnosis not present

## 2023-05-17 DIAGNOSIS — H60502 Unspecified acute noninfective otitis externa, left ear: Secondary | ICD-10-CM

## 2023-05-17 DIAGNOSIS — E119 Type 2 diabetes mellitus without complications: Secondary | ICD-10-CM | POA: Insufficient documentation

## 2023-05-17 DIAGNOSIS — I10 Essential (primary) hypertension: Secondary | ICD-10-CM | POA: Diagnosis not present

## 2023-05-17 DIAGNOSIS — H9202 Otalgia, left ear: Secondary | ICD-10-CM | POA: Insufficient documentation

## 2023-05-17 LAB — CBC WITH DIFFERENTIAL/PLATELET
Abs Immature Granulocytes: 0.03 10*3/uL (ref 0.00–0.07)
Basophils Absolute: 0 10*3/uL (ref 0.0–0.1)
Basophils Relative: 0 %
Eosinophils Absolute: 0.3 10*3/uL (ref 0.0–0.5)
Eosinophils Relative: 3 %
HCT: 37.6 % — ABNORMAL LOW (ref 39.0–52.0)
Hemoglobin: 13.2 g/dL (ref 13.0–17.0)
Immature Granulocytes: 0 %
Lymphocytes Relative: 26 %
Lymphs Abs: 2.6 10*3/uL (ref 0.7–4.0)
MCH: 31.8 pg (ref 26.0–34.0)
MCHC: 35.1 g/dL (ref 30.0–36.0)
MCV: 90.6 fL (ref 80.0–100.0)
Monocytes Absolute: 0.6 10*3/uL (ref 0.1–1.0)
Monocytes Relative: 6 %
Neutro Abs: 6.6 10*3/uL (ref 1.7–7.7)
Neutrophils Relative %: 65 %
Platelets: 247 10*3/uL (ref 150–400)
RBC: 4.15 MIL/uL — ABNORMAL LOW (ref 4.22–5.81)
RDW: 12.5 % (ref 11.5–15.5)
WBC: 10 10*3/uL (ref 4.0–10.5)
nRBC: 0 % (ref 0.0–0.2)

## 2023-05-17 LAB — BASIC METABOLIC PANEL
Anion gap: 7 (ref 5–15)
BUN: 20 mg/dL (ref 8–23)
CO2: 24 mmol/L (ref 22–32)
Calcium: 8.3 mg/dL — ABNORMAL LOW (ref 8.9–10.3)
Chloride: 102 mmol/L (ref 98–111)
Creatinine, Ser: 1.19 mg/dL (ref 0.61–1.24)
GFR, Estimated: >60 mL/min (ref >60)
Glucose, Bld: 234 mg/dL — ABNORMAL HIGH (ref 70–99)
Potassium: 4.1 mmol/L (ref 3.5–5.1)
Sodium: 133 mmol/L — ABNORMAL LOW (ref 135–145)

## 2023-05-17 MED ORDER — IOHEXOL 300 MG/ML  SOLN
80.0000 mL | Freq: Once | INTRAMUSCULAR | Status: AC | PRN
  Administered 2023-05-17: 80 mL via INTRAVENOUS

## 2023-05-17 NOTE — ED Triage Notes (Addendum)
Pt states he was trying to stretch & then he felt something in his neck "that felt like a jelly packet being smushed." Since then his neck pain has increasingly gotten worse.  Pt states he also has had sore throat & been on antibiotics but not gotten better. Thinks his sore throat infection has moved into his neck.

## 2023-05-18 MED ORDER — OFLOXACIN 0.3 % OT SOLN: 5.0000 [drp] | Freq: Every day | OTIC | 0 refills | Status: DC

## 2023-05-18 MED ORDER — OFLOXACIN 0.3 % OT SOLN: 5.0000 [drp] | Freq: Every day | OTIC | 0 refills | Status: AC

## 2023-05-18 NOTE — ED Provider Notes (Signed)
 Care assumed from [facility], PA-C at shift change. Please see their note for further information.   Briefly: Patient with dental pain, sore throat, ear pain. Reportedly on azithro for same without improvement. Has not seen a dentist.   Plan: Patient with poor dentition throughout, no obvious dental caries, no gross abscess or facial swelling. Did have mild erythema in the left ear canal without drainage. Tonsils mildly enlarged bilaterally without exudate. No sign of PTA or RPA. Labs and CT max/face pending at shift change. If normal, plan to d/c with ofloxacin  drops, suspicion for referred pain from this with dental resources.   12:00 AM: No leukocytosis on labs, Na 133, glucose 234.   CT max/face has resulted and reveals  Normal CT of the face.   Plan to treat for otitis externa with ofloxacin  drops per previous providers recommendations.  Dental resources given as well. Evaluation and diagnostic testing in the emergency department does not suggest an emergent condition requiring admission or immediate intervention beyond what has been performed at this time.  Plan for discharge with close PCP follow-up.  Patient is understanding and amenable with plan, educated on red flag symptoms that would prompt immediate return.  Patient discharged in stable condition.    Michelina Mexicano A, PA-C 05/18/23 ROBERTA    Melvenia Motto, MD 05/18/23 [phone]
